# Patient Record
Sex: Female | Born: 1950
Health system: Southern US, Community
[De-identification: ages and names within clinical notes are randomized; demographics above are authoritative.]

## PROBLEM LIST (undated history)

## (undated) DIAGNOSIS — M25473 Effusion, unspecified ankle: Secondary | ICD-10-CM

## (undated) DIAGNOSIS — Z87442 Personal history of urinary calculi: Secondary | ICD-10-CM

## (undated) DIAGNOSIS — M1990 Unspecified osteoarthritis, unspecified site: Secondary | ICD-10-CM

## (undated) DIAGNOSIS — R0600 Dyspnea, unspecified: Secondary | ICD-10-CM

## (undated) DIAGNOSIS — Z9889 Other specified postprocedural states: Secondary | ICD-10-CM

## (undated) DIAGNOSIS — J45909 Unspecified asthma, uncomplicated: Secondary | ICD-10-CM

## (undated) DIAGNOSIS — I10 Essential (primary) hypertension: Secondary | ICD-10-CM

## (undated) DIAGNOSIS — R112 Nausea with vomiting, unspecified: Secondary | ICD-10-CM

## (undated) DIAGNOSIS — E039 Hypothyroidism, unspecified: Secondary | ICD-10-CM

## (undated) HISTORY — PX: CARPAL TUNNEL RELEASE: SHX101

## (undated) HISTORY — PX: EYE SURGERY: SHX253

## (undated) HISTORY — PX: FOOT SURGERY: SHX648

## (undated) HISTORY — PX: JOINT REPLACEMENT: SHX530

## (undated) HISTORY — PX: HERNIA REPAIR: SHX51

## (undated) HISTORY — PX: CHOLECYSTECTOMY: SHX55

## (undated) HISTORY — PX: TOTAL HIP ARTHROPLASTY: SHX124

## (undated) HISTORY — PX: KIDNEY STONE SURGERY: SHX686

---

## 2007-02-28 ENCOUNTER — Ambulatory Visit: Payer: Self-pay | Admitting: Dermatology

## 2008-12-09 ENCOUNTER — Ambulatory Visit: Payer: Self-pay | Admitting: Family Medicine

## 2008-12-09 DIAGNOSIS — L03119 Cellulitis of unspecified part of limb: Secondary | ICD-10-CM

## 2008-12-09 DIAGNOSIS — L02619 Cutaneous abscess of unspecified foot: Secondary | ICD-10-CM

## 2008-12-14 ENCOUNTER — Ambulatory Visit: Payer: Self-pay | Admitting: Family Medicine

## 2009-02-07 IMAGING — CR DG CHEST 2V
1 series · 2 of 2 positions shown · non-contrast
Comparison: none

REASON FOR EXAM: SARCOID - SEND REPORT
COMMENTS:

[Series 1: view not recorded · 0.17mm/px · 2 of 2 slices shown]
[im 1/2]
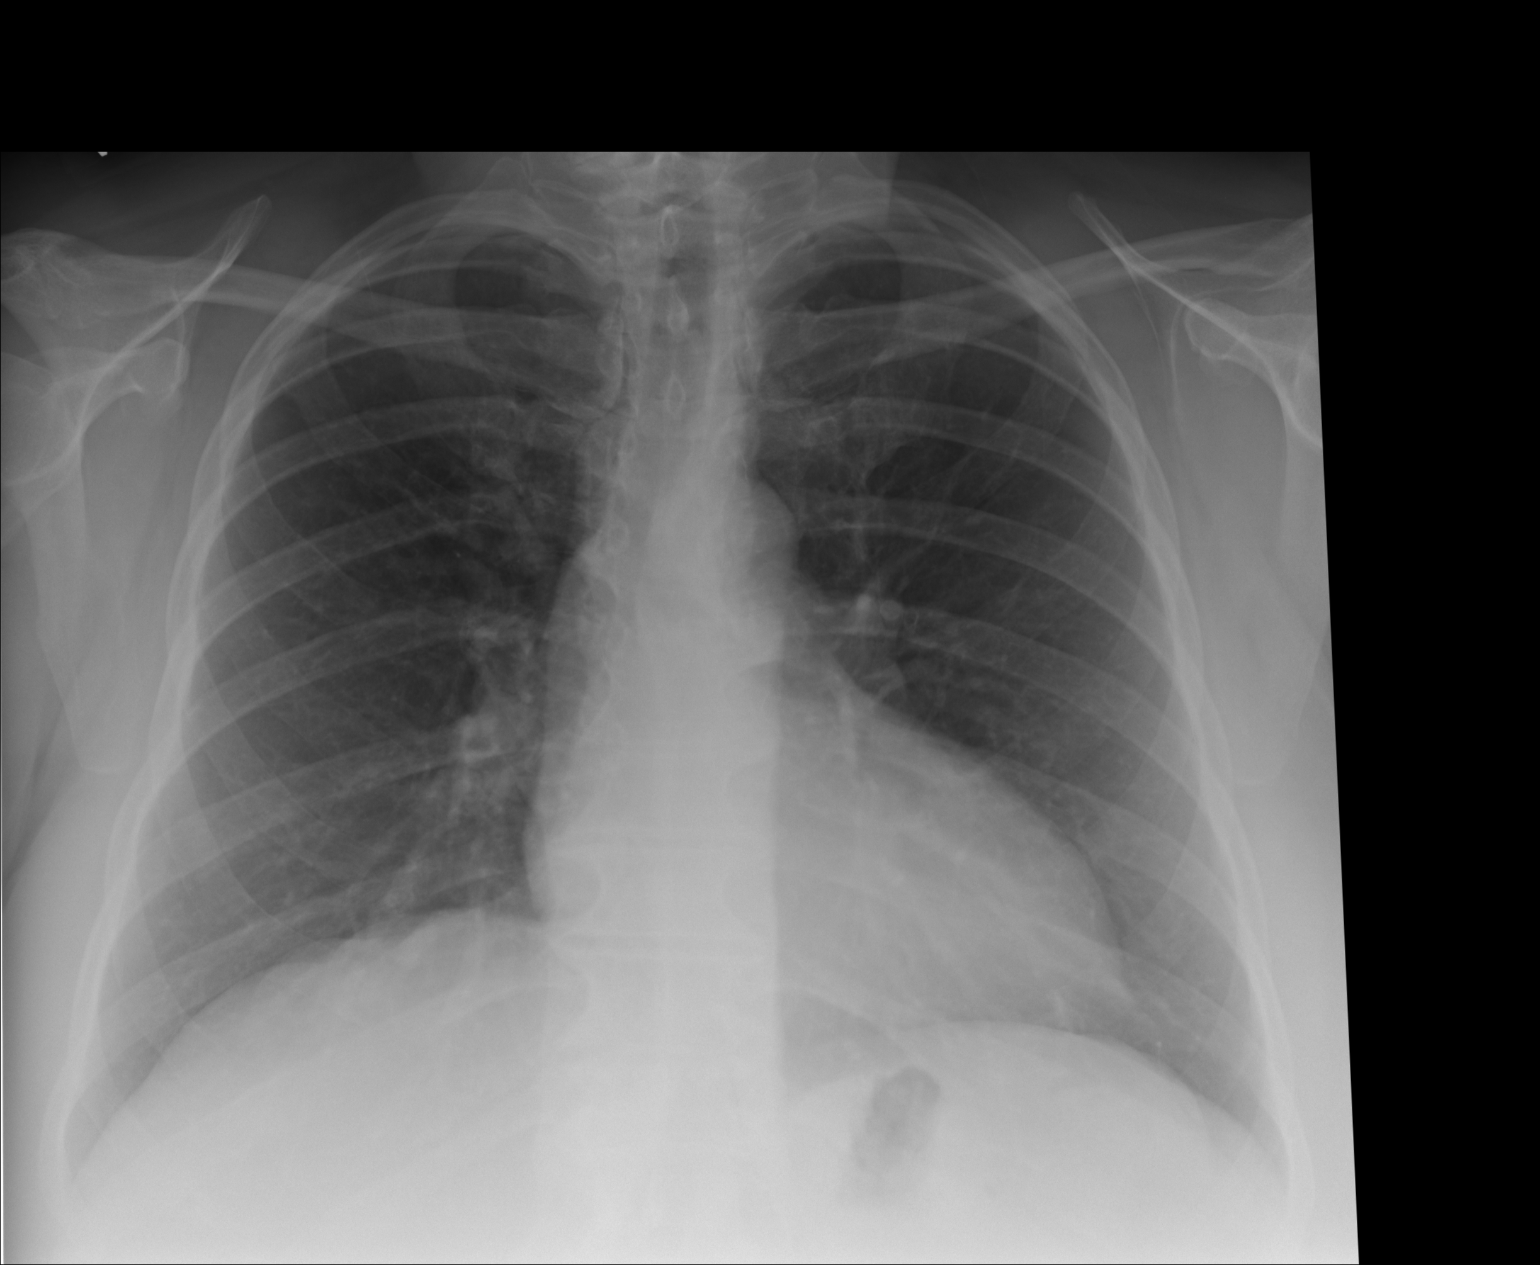
[im 2/2]
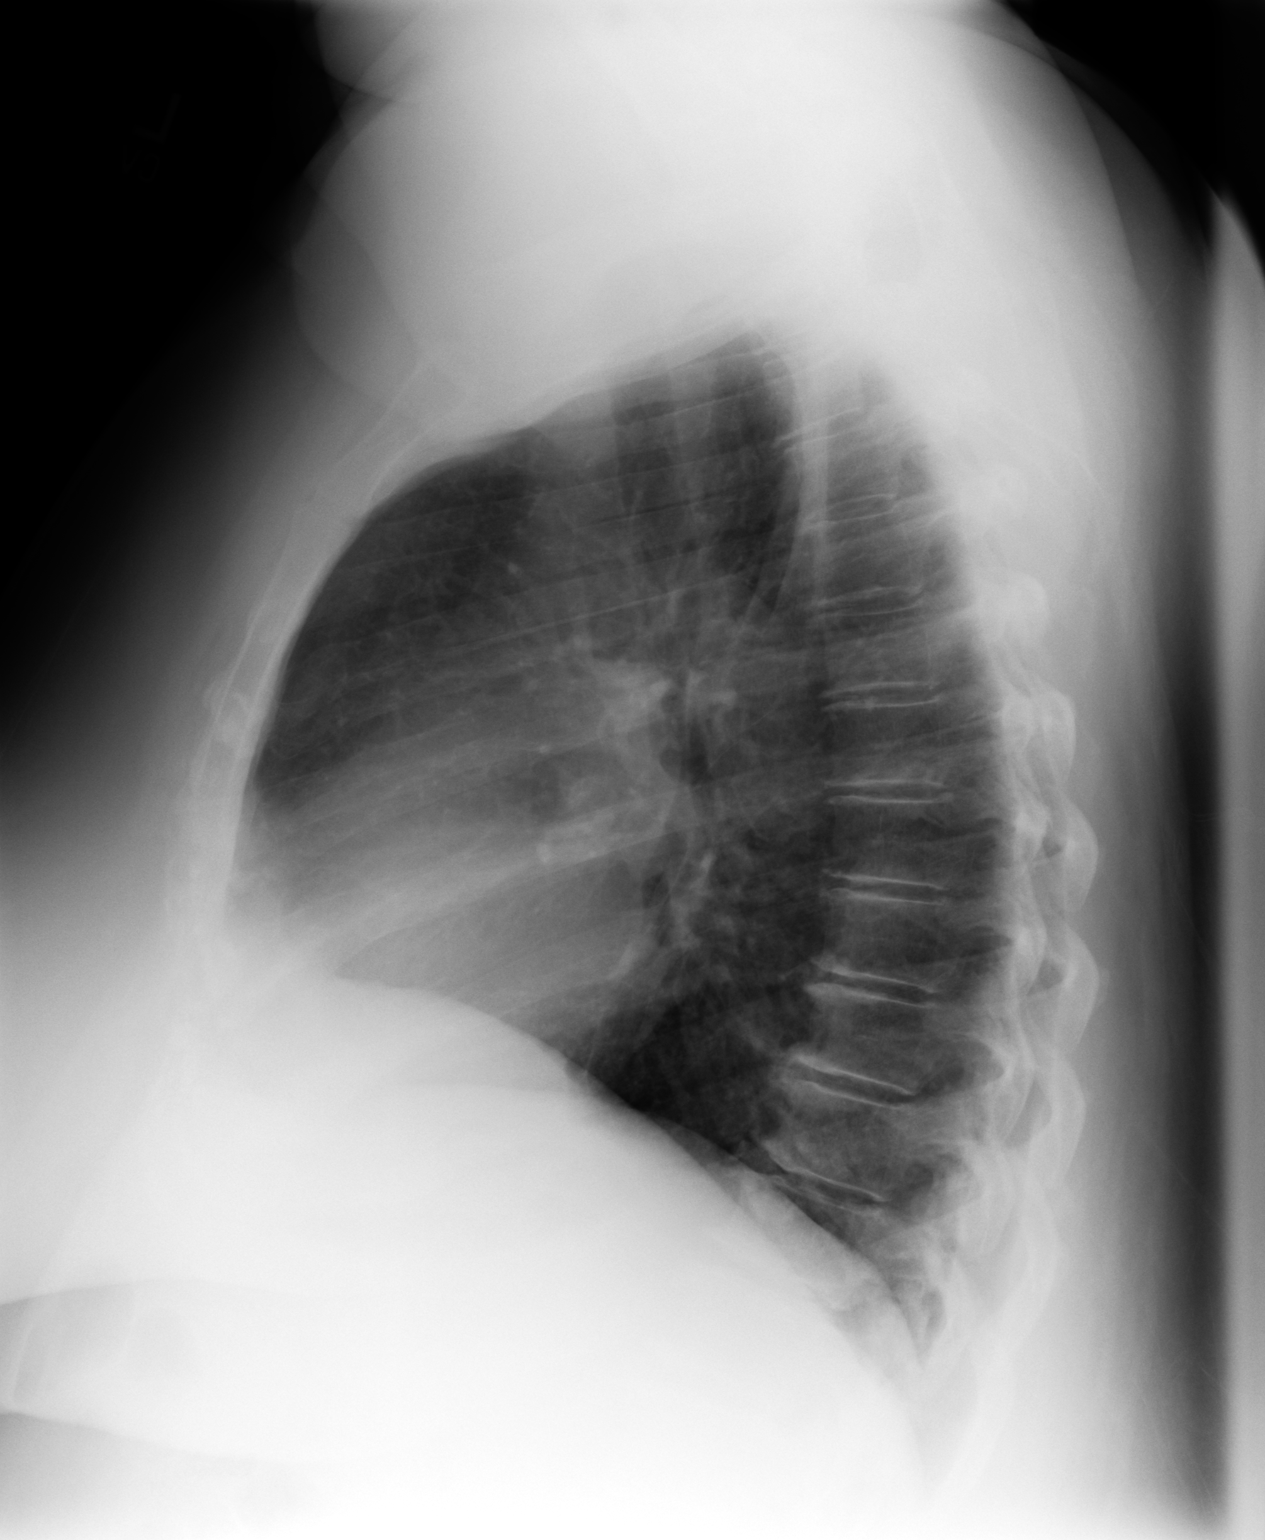

[2 of 2 positions shown; findings below may reference images not displayed]

PROCEDURE:     DXR - DXR CHEST PA (OR AP) AND LATERAL  - February 28, 2007 [DATE]

RESULT:     There no prior studies for comparison. The lungs are clear. The
heart and pulmonary vessels are normal. The bony and mediastinal structures
are unremarkable. There is no effusion. There is no pneumothorax or evidence
of congestive failure.
IMPRESSION: No acute cardiopulmonary disease.

## 2009-10-26 ENCOUNTER — Emergency Department: Payer: Self-pay | Admitting: Emergency Medicine

## 2019-05-03 ENCOUNTER — Other Ambulatory Visit: Payer: Self-pay

## 2019-05-03 ENCOUNTER — Encounter: Payer: Self-pay | Admitting: *Deleted

## 2019-05-05 ENCOUNTER — Other Ambulatory Visit
Admission: RE | Admit: 2019-05-05 | Discharge: 2019-05-05 | Disposition: A | Discharge status: Home / Self Care | Payer: BLUE CROSS/BLUE SHIELD | Source: Ambulatory Visit | Attending: Ophthalmology | Admitting: Ophthalmology

## 2019-05-05 ENCOUNTER — Other Ambulatory Visit: Payer: Self-pay

## 2019-05-05 DIAGNOSIS — Z01812 Encounter for preprocedural laboratory examination: Secondary | ICD-10-CM | POA: Insufficient documentation

## 2019-05-05 DIAGNOSIS — Z20828 Contact with and (suspected) exposure to other viral communicable diseases: Secondary | ICD-10-CM | POA: Diagnosis not present

## 2019-05-06 LAB — SARS CORONAVIRUS 2 (TAT 6-24 HRS): SARS Coronavirus 2: NEGATIVE

## 2019-05-09 NOTE — Discharge Instructions (Signed)

## 2019-05-10 ENCOUNTER — Ambulatory Visit
Admission: RE | Admit: 2019-05-10 | Discharge: 2019-05-10 | Disposition: A | Discharge status: Home / Self Care | Payer: Medicare HMO | Attending: Ophthalmology | Admitting: Ophthalmology

## 2019-05-10 ENCOUNTER — Ambulatory Visit: Payer: Medicare HMO | Admitting: Anesthesiology

## 2019-05-10 ENCOUNTER — Encounter
Admission: RE | Disposition: A | Discharge status: Home / Self Care | Payer: Self-pay | Source: Home / Self Care | Attending: Ophthalmology

## 2019-05-10 ENCOUNTER — Other Ambulatory Visit: Payer: Self-pay

## 2019-05-10 DIAGNOSIS — Z87891 Personal history of nicotine dependence: Secondary | ICD-10-CM | POA: Insufficient documentation

## 2019-05-10 DIAGNOSIS — H2511 Age-related nuclear cataract, right eye: Secondary | ICD-10-CM | POA: Insufficient documentation

## 2019-05-10 DIAGNOSIS — Z79899 Other long term (current) drug therapy: Secondary | ICD-10-CM | POA: Diagnosis not present

## 2019-05-10 DIAGNOSIS — E039 Hypothyroidism, unspecified: Secondary | ICD-10-CM | POA: Insufficient documentation

## 2019-05-10 DIAGNOSIS — Z79890 Hormone replacement therapy (postmenopausal): Secondary | ICD-10-CM | POA: Diagnosis not present

## 2019-05-10 DIAGNOSIS — Z6841 Body Mass Index (BMI) 40.0 and over, adult: Secondary | ICD-10-CM | POA: Diagnosis not present

## 2019-05-10 DIAGNOSIS — I10 Essential (primary) hypertension: Secondary | ICD-10-CM | POA: Diagnosis not present

## 2019-05-10 DIAGNOSIS — M1990 Unspecified osteoarthritis, unspecified site: Secondary | ICD-10-CM | POA: Insufficient documentation

## 2019-05-10 DIAGNOSIS — Z96643 Presence of artificial hip joint, bilateral: Secondary | ICD-10-CM | POA: Diagnosis not present

## 2019-05-10 HISTORY — PX: CATARACT EXTRACTION W/PHACO: SHX586

## 2019-05-10 HISTORY — DX: Personal history of urinary calculi: Z87.442

## 2019-05-10 HISTORY — DX: Hypothyroidism, unspecified: E03.9

## 2019-05-10 HISTORY — DX: Effusion, unspecified ankle: M25.473

## 2019-05-10 HISTORY — DX: Other specified postprocedural states: R11.2

## 2019-05-10 HISTORY — DX: Other specified postprocedural states: Z98.890

## 2019-05-10 HISTORY — DX: Unspecified osteoarthritis, unspecified site: M19.90

## 2019-05-10 HISTORY — DX: Essential (primary) hypertension: I10

## 2019-05-10 SURGERY — PHACOEMULSIFICATION, CATARACT, WITH IOL INSERTION
Anesthesia: Monitor Anesthesia Care | Site: Eye | Laterality: Right

## 2019-05-10 MED ORDER — EPINEPHRINE PF 1 MG/ML IJ SOLN
INTRAOCULAR | Status: DC | PRN
  Administered 2019-05-10: 48 mL via OPHTHALMIC

## 2019-05-10 MED ORDER — LACTATED RINGERS IV SOLN: INTRAVENOUS | Status: DC

## 2019-05-10 MED ORDER — MIDAZOLAM HCL 2 MG/2ML IJ SOLN
INTRAMUSCULAR | Status: DC | PRN
  Administered 2019-05-10: 2 mg via INTRAVENOUS

## 2019-05-10 MED ORDER — MOXIFLOXACIN HCL 0.5 % OP SOLN
1.0000 [drp] | OPHTHALMIC | Status: DC | PRN
  Administered 2019-05-10 (×3): 1 [drp] via OPHTHALMIC

## 2019-05-10 MED ORDER — FENTANYL CITRATE (PF) 100 MCG/2ML IJ SOLN
INTRAMUSCULAR | Status: DC | PRN
  Administered 2019-05-10 (×2): 50 ug via INTRAVENOUS

## 2019-05-10 MED ORDER — LIDOCAINE HCL (PF) 2 % IJ SOLN
INTRAOCULAR | Status: DC | PRN
  Administered 2019-05-10: 1 mL

## 2019-05-10 MED ORDER — ONDANSETRON HCL 4 MG/2ML IJ SOLN: 4.0000 mg | Freq: Once | INTRAMUSCULAR | Status: DC | PRN

## 2019-05-10 MED ORDER — ARMC OPHTHALMIC DILATING DROPS
1.0000 "application " | OPHTHALMIC | Status: DC | PRN
  Administered 2019-05-10 (×3): 1 via OPHTHALMIC

## 2019-05-10 MED ORDER — BRIMONIDINE TARTRATE-TIMOLOL 0.2-0.5 % OP SOLN
OPHTHALMIC | Status: DC | PRN
  Administered 2019-05-10: 1 [drp] via OPHTHALMIC

## 2019-05-10 MED ORDER — NA HYALUR & NA CHOND-NA HYALUR 0.4-0.35 ML IO KIT
PACK | INTRAOCULAR | Status: DC | PRN
  Administered 2019-05-10: 1 mL via INTRAOCULAR

## 2019-05-10 MED ORDER — CEFUROXIME OPHTHALMIC INJECTION 1 MG/0.1 ML
INJECTION | OPHTHALMIC | Status: DC | PRN
  Administered 2019-05-10: 0.1 mL via INTRACAMERAL

## 2019-05-10 MED ORDER — TETRACAINE HCL 0.5 % OP SOLN
1.0000 [drp] | OPHTHALMIC | Status: DC | PRN
  Administered 2019-05-10 (×3): 1 [drp] via OPHTHALMIC

## 2019-05-10 SURGICAL SUPPLY — 16 items
CANNULA ANT/CHMB 27G (MISCELLANEOUS) ×1 IMPLANT
CANNULA ANT/CHMB 27GA (MISCELLANEOUS) ×3 IMPLANT
GLOVE SURG LX 7.5 STRW (GLOVE) ×2
GLOVE SURG LX STRL 7.5 STRW (GLOVE) ×1 IMPLANT
GLOVE SURG TRIUMPH 8.0 PF LTX (GLOVE) ×3 IMPLANT
GOWN STRL REUS W/ TWL LRG LVL3 (GOWN DISPOSABLE) ×2 IMPLANT
GOWN STRL REUS W/TWL LRG LVL3 (GOWN DISPOSABLE) ×4
LENS IOL TECNIS ITEC 23.5 (Intraocular Lens) ×2 IMPLANT
MARKER SKIN DUAL TIP RULER LAB (MISCELLANEOUS) ×3 IMPLANT
PACK CATARACT BRASINGTON (MISCELLANEOUS) ×3 IMPLANT
PACK EYE AFTER SURG (MISCELLANEOUS) ×3 IMPLANT
PACK OPTHALMIC (MISCELLANEOUS) ×3 IMPLANT
SYR 3ML LL SCALE MARK (SYRINGE) ×3 IMPLANT
SYR TB 1ML LUER SLIP (SYRINGE) ×3 IMPLANT
WATER STERILE IRR 500ML POUR (IV SOLUTION) ×3 IMPLANT
WIPE NON LINTING 3.25X3.25 (MISCELLANEOUS) ×3 IMPLANT

## 2019-05-10 NOTE — H&P (Signed)

## 2019-05-10 NOTE — Anesthesia Procedure Notes (Signed)
Procedure Name: MAC Performed by: Briell Paulette, CRNA Pre-anesthesia Checklist: Patient identified, Emergency Drugs available, Suction available, Timeout performed and Patient being monitored Patient Re-evaluated:Patient Re-evaluated prior to induction Oxygen Delivery Method: Nasal cannula Placement Confirmation: positive ETCO2       

## 2019-05-10 NOTE — Anesthesia Preprocedure Evaluation (Signed)
Anesthesia Evaluation  Patient identified by MRN, date of birth, ID band Patient awake    History of Anesthesia Complications (+) PONV and history of anesthetic complications  Airway Mallampati: II  TM Distance: >3 FB Neck ROM: Full    Dental no notable dental hx.    Pulmonary former smoker,    Pulmonary exam normal breath sounds clear to auscultation       Cardiovascular Exercise Tolerance: Good hypertension, Normal cardiovascular exam Rhythm:Regular Rate:Normal     Neuro/Psych negative psych ROS   GI/Hepatic   Endo/Other  Hypothyroidism Morbid obesity  Renal/GU      Musculoskeletal  (+) Arthritis ,   Abdominal (+) + obese,   Peds  Hematology   Anesthesia Other Findings   Reproductive/Obstetrics                             Anesthesia Physical Anesthesia Plan  ASA: III  Anesthesia Plan: MAC   Post-op Pain Management:    Induction: Intravenous  PONV Risk Score and Plan: 3 and Treatment may vary due to age or medical condition  Airway Management Planned: Natural Airway  Additional Equipment:   Intra-op Plan:   Post-operative Plan:   Informed Consent: I have reviewed the patients History and Physical, chart, labs and discussed the procedure including the risks, benefits and alternatives for the proposed anesthesia with the patient or authorized representative who has indicated his/her understanding and acceptance.     Dental advisory given  Plan Discussed with: CRNA and Anesthesiologist  Anesthesia Plan Comments:         Anesthesia Quick Evaluation  Patient Active Problem List   Diagnosis Date Noted  . CELLULITIS, FOOT, RIGHT 12/09/2008    No flowsheet data found. No flowsheet data found.  Risks and benefits of anesthesia discussed at length, patient or surrogate demonstrates understanding. Appropriately NPO. Plan to proceed with anesthesia.  Champ Mungo,  MD 05/10/19

## 2019-05-10 NOTE — Op Note (Signed)
LOCATION:  [facility]   PREOPERATIVE DIAGNOSIS:    Nuclear sclerotic cataract right eye. H25.11   POSTOPERATIVE DIAGNOSIS:  Nuclear sclerotic cataract right eye.     PROCEDURE:  Phacoemusification with posterior chamber intraocular lens placement of the right eye   ULTRASOUND TIME: Procedure(s): CATARACT EXTRACTION PHACO AND INTRAOCULAR LENS PLACEMENT (IOC) RIGHT  00:42.7  18.4%  7.89 (Right)  LENS:   Implant Name Type Inv. Item Serial No. Manufacturer Lot No. LRB No. Used Action  LENS IOL DIOP 23.5 - [id number] Intraocular Lens LENS IOL DIOP 23.5 [id number]  Right 1 Implanted         SURGEON:  Wyonia Hough, MD   ANESTHESIA:  Topical with tetracaine drops and 2% Xylocaine jelly, augmented with 1% preservative-free intracameral lidocaine.    COMPLICATIONS:  None.   DESCRIPTION OF PROCEDURE:  The patient was identified in the holding room and transported to the operating room and placed in the supine position under the operating microscope.  The right eye was identified as the operative eye and it was prepped and draped in the usual sterile ophthalmic fashion.   A 1 millimeter clear-corneal paracentesis was made at the 12:00 position.  0.5 ml of preservative-free 1% lidocaine was injected into the anterior chamber. The anterior chamber was filled with Viscoat viscoelastic.  A 2.4 millimeter keratome was used to make a near-clear corneal incision at the 9:00 position.  A curvilinear capsulorrhexis was made with a cystotome and capsulorrhexis forceps.  Balanced salt solution was used to hydrodissect and hydrodelineate the nucleus.   Phacoemulsification was then used in stop and chop fashion to remove the lens nucleus and epinucleus.  The remaining cortex was then removed using the irrigation and aspiration handpiece. Provisc was then placed into the capsular bag to distend it for lens placement.  A lens was then injected into the capsular bag.  The remaining  viscoelastic was aspirated.   Wounds were hydrated with balanced salt solution.  The anterior chamber was inflated to a physiologic pressure with balanced salt solution.  No wound leaks were noted. Cefuroxime 0.1 ml of a 10mg /ml solution was injected into the anterior chamber for a dose of 1 mg of intracameral antibiotic at the completion of the case.   Timolol and Brimonidine drops were applied to the eye.  The patient was taken to the recovery room in stable condition without complications of anesthesia or surgery.   Laquitta Dominski 05/10/2019, 8:55 AM

## 2019-05-10 NOTE — Anesthesia Postprocedure Evaluation (Signed)
Anesthesia Post Note  Patient: Dawn Lynch  Procedure(s) Performed: CATARACT EXTRACTION PHACO AND INTRAOCULAR LENS PLACEMENT (IOC) RIGHT  00:42.7  18.4%  7.89 (Right Eye)  Patient location during evaluation: PACU Anesthesia Type: MAC Level of consciousness: awake and alert Pain management: pain level controlled Vital Signs Assessment: post-procedure vital signs reviewed and stable Respiratory status: spontaneous breathing, nonlabored ventilation, respiratory function stable and patient connected to nasal cannula oxygen Cardiovascular status: stable and blood pressure returned to baseline Postop Assessment: no apparent nausea or vomiting Anesthetic complications: no    Sinda Du

## 2019-05-10 NOTE — Transfer of Care (Signed)
Immediate Anesthesia Transfer of Care Note  Patient: Dawn Lynch  Procedure(s) Performed: CATARACT EXTRACTION PHACO AND INTRAOCULAR LENS PLACEMENT (IOC) RIGHT  00:42.7  18.4%  7.89 (Right Eye)  Patient Location: PACU  Anesthesia Type: MAC  Level of Consciousness: awake, alert  and patient cooperative  Airway and Oxygen Therapy: Patient Spontanous Breathing and Patient connected to supplemental oxygen  Post-op Assessment: Post-op Vital signs reviewed, Patient's Cardiovascular Status Stable, Respiratory Function Stable, Patent Airway and No signs of Nausea or vomiting  Post-op Vital Signs: Reviewed and stable  Complications: No apparent anesthesia complications

## 2019-05-11 ENCOUNTER — Encounter: Payer: Self-pay | Admitting: Ophthalmology

## 2019-05-23 ENCOUNTER — Encounter: Payer: Self-pay | Admitting: *Deleted

## 2019-05-23 ENCOUNTER — Other Ambulatory Visit: Payer: Self-pay

## 2019-05-26 ENCOUNTER — Other Ambulatory Visit: Payer: Self-pay

## 2019-05-26 ENCOUNTER — Other Ambulatory Visit
Admission: RE | Admit: 2019-05-26 | Discharge: 2019-05-26 | Disposition: A | Discharge status: Home / Self Care | Payer: BLUE CROSS/BLUE SHIELD | Source: Ambulatory Visit | Attending: Ophthalmology | Admitting: Ophthalmology

## 2019-05-26 DIAGNOSIS — Z01812 Encounter for preprocedural laboratory examination: Secondary | ICD-10-CM | POA: Insufficient documentation

## 2019-05-26 DIAGNOSIS — Z20828 Contact with and (suspected) exposure to other viral communicable diseases: Secondary | ICD-10-CM | POA: Insufficient documentation

## 2019-05-27 LAB — SARS CORONAVIRUS 2 (TAT 6-24 HRS): SARS Coronavirus 2: NEGATIVE

## 2019-05-29 NOTE — Discharge Instructions (Signed)

## 2019-05-31 ENCOUNTER — Ambulatory Visit
Admission: RE | Admit: 2019-05-31 | Discharge: 2019-05-31 | Disposition: A | Discharge status: Home / Self Care | Payer: Medicare HMO | Attending: Ophthalmology | Admitting: Ophthalmology

## 2019-05-31 ENCOUNTER — Ambulatory Visit: Payer: Medicare HMO | Admitting: Anesthesiology

## 2019-05-31 ENCOUNTER — Encounter
Admission: RE | Disposition: A | Discharge status: Home / Self Care | Payer: Self-pay | Source: Home / Self Care | Attending: Ophthalmology

## 2019-05-31 ENCOUNTER — Other Ambulatory Visit: Payer: Self-pay

## 2019-05-31 DIAGNOSIS — Z87891 Personal history of nicotine dependence: Secondary | ICD-10-CM | POA: Insufficient documentation

## 2019-05-31 DIAGNOSIS — Z96643 Presence of artificial hip joint, bilateral: Secondary | ICD-10-CM | POA: Insufficient documentation

## 2019-05-31 DIAGNOSIS — H2512 Age-related nuclear cataract, left eye: Secondary | ICD-10-CM | POA: Diagnosis present

## 2019-05-31 DIAGNOSIS — Z79890 Hormone replacement therapy (postmenopausal): Secondary | ICD-10-CM | POA: Diagnosis not present

## 2019-05-31 DIAGNOSIS — M1990 Unspecified osteoarthritis, unspecified site: Secondary | ICD-10-CM | POA: Diagnosis not present

## 2019-05-31 DIAGNOSIS — I10 Essential (primary) hypertension: Secondary | ICD-10-CM | POA: Diagnosis not present

## 2019-05-31 DIAGNOSIS — Z79899 Other long term (current) drug therapy: Secondary | ICD-10-CM | POA: Insufficient documentation

## 2019-05-31 DIAGNOSIS — E039 Hypothyroidism, unspecified: Secondary | ICD-10-CM | POA: Diagnosis not present

## 2019-05-31 HISTORY — PX: CATARACT EXTRACTION W/PHACO: SHX586

## 2019-05-31 SURGERY — PHACOEMULSIFICATION, CATARACT, WITH IOL INSERTION
Anesthesia: Monitor Anesthesia Care | Site: Eye | Laterality: Left

## 2019-05-31 MED ORDER — LIDOCAINE HCL (PF) 2 % IJ SOLN
INTRAOCULAR | Status: DC | PRN
  Administered 2019-05-31: 1 mL

## 2019-05-31 MED ORDER — EPINEPHRINE PF 1 MG/ML IJ SOLN
INTRAOCULAR | Status: DC | PRN
  Administered 2019-05-31: 59 mL via OPHTHALMIC

## 2019-05-31 MED ORDER — FENTANYL CITRATE (PF) 100 MCG/2ML IJ SOLN
INTRAMUSCULAR | Status: DC | PRN
  Administered 2019-05-31 (×2): 50 ug via INTRAVENOUS

## 2019-05-31 MED ORDER — TETRACAINE HCL 0.5 % OP SOLN
1.0000 [drp] | OPHTHALMIC | Status: DC | PRN
  Administered 2019-05-31 (×3): 1 [drp] via OPHTHALMIC

## 2019-05-31 MED ORDER — ACETAMINOPHEN 325 MG PO TABS: 325.0000 mg | ORAL_TABLET | ORAL | Status: DC | PRN

## 2019-05-31 MED ORDER — MOXIFLOXACIN HCL 0.5 % OP SOLN
1.0000 [drp] | OPHTHALMIC | Status: DC | PRN
  Administered 2019-05-31 (×3): 1 [drp] via OPHTHALMIC

## 2019-05-31 MED ORDER — CEFUROXIME OPHTHALMIC INJECTION 1 MG/0.1 ML
INJECTION | OPHTHALMIC | Status: DC | PRN
  Administered 2019-05-31: 0.1 mL via INTRACAMERAL

## 2019-05-31 MED ORDER — MIDAZOLAM HCL 2 MG/2ML IJ SOLN
INTRAMUSCULAR | Status: DC | PRN
  Administered 2019-05-31: 2 mg via INTRAVENOUS

## 2019-05-31 MED ORDER — ARMC OPHTHALMIC DILATING DROPS
1.0000 "application " | OPHTHALMIC | Status: DC | PRN
  Administered 2019-05-31 (×3): 1 via OPHTHALMIC

## 2019-05-31 MED ORDER — ACETAMINOPHEN 160 MG/5ML PO SOLN: 325.0000 mg | ORAL | Status: DC | PRN

## 2019-05-31 MED ORDER — NA HYALUR & NA CHOND-NA HYALUR 0.4-0.35 ML IO KIT
PACK | INTRAOCULAR | Status: DC | PRN
  Administered 2019-05-31: 1 mL via INTRAOCULAR

## 2019-05-31 MED ORDER — BRIMONIDINE TARTRATE-TIMOLOL 0.2-0.5 % OP SOLN
OPHTHALMIC | Status: DC | PRN
  Administered 2019-05-31: 1 [drp] via OPHTHALMIC

## 2019-05-31 SURGICAL SUPPLY — 16 items
CANNULA ANT/CHMB 27G (MISCELLANEOUS) ×1 IMPLANT
CANNULA ANT/CHMB 27GA (MISCELLANEOUS) ×3 IMPLANT
GLOVE SURG LX 7.5 STRW (GLOVE) ×2
GLOVE SURG LX STRL 7.5 STRW (GLOVE) ×1 IMPLANT
GLOVE SURG TRIUMPH 8.0 PF LTX (GLOVE) ×3 IMPLANT
GOWN STRL REUS W/ TWL LRG LVL3 (GOWN DISPOSABLE) ×2 IMPLANT
GOWN STRL REUS W/TWL LRG LVL3 (GOWN DISPOSABLE) ×4
LENS IOL TECNIS ITEC 22.5 (Intraocular Lens) ×2 IMPLANT
MARKER SKIN DUAL TIP RULER LAB (MISCELLANEOUS) ×3 IMPLANT
PACK CATARACT BRASINGTON (MISCELLANEOUS) ×3 IMPLANT
PACK EYE AFTER SURG (MISCELLANEOUS) ×3 IMPLANT
PACK OPTHALMIC (MISCELLANEOUS) ×3 IMPLANT
SYR 3ML LL SCALE MARK (SYRINGE) ×3 IMPLANT
SYR TB 1ML LUER SLIP (SYRINGE) ×3 IMPLANT
WATER STERILE IRR 500ML POUR (IV SOLUTION) ×3 IMPLANT
WIPE NON LINTING 3.25X3.25 (MISCELLANEOUS) ×3 IMPLANT

## 2019-05-31 NOTE — H&P (Signed)

## 2019-05-31 NOTE — Anesthesia Postprocedure Evaluation (Signed)
Anesthesia Post Note  Patient: Dawn Lynch  Procedure(s) Performed: CATARACT EXTRACTION PHACO AND INTRAOCULAR LENS PLACEMENT (IOC) LEFT 3.65  00:36.9  9.9% (Left Eye)     Patient location during evaluation: PACU Anesthesia Type: MAC Level of consciousness: awake and alert Pain management: pain level controlled Vital Signs Assessment: post-procedure vital signs reviewed and stable Respiratory status: spontaneous breathing, nonlabored ventilation, respiratory function stable and patient connected to nasal cannula oxygen Cardiovascular status: stable and blood pressure returned to baseline Postop Assessment: no apparent nausea or vomiting Anesthetic complications: no    Trecia Rogers

## 2019-05-31 NOTE — Op Note (Signed)
OPERATIVE NOTE  Dawn Lynch [id number] 05/31/2019   PREOPERATIVE DIAGNOSIS:  Nuclear sclerotic cataract left eye. H25.12   POSTOPERATIVE DIAGNOSIS:    Nuclear sclerotic cataract left eye.     PROCEDURE:  Phacoemusification with posterior chamber intraocular lens placement of the left eye  Ultrasound time: Procedure(s): CATARACT EXTRACTION PHACO AND INTRAOCULAR LENS PLACEMENT (IOC) LEFT 3.65  00:36.9  9.9% (Left)  LENS:   Implant Name Type Inv. Item Serial No. Manufacturer Lot No. LRB No. Used Action  LENS IOL DIOP 22.5 - [id number] Intraocular Lens LENS IOL DIOP 22.5 [id number]  Left 1 Implanted      SURGEON:  Wyonia Hough, MD   ANESTHESIA:  Topical with tetracaine drops and 2% Xylocaine jelly, augmented with 1% preservative-free intracameral lidocaine.    COMPLICATIONS:  None.   DESCRIPTION OF PROCEDURE:  The patient was identified in the holding room and transported to the operating room and placed in the supine position under the operating microscope.  The left eye was identified as the operative eye and it was prepped and draped in the usual sterile ophthalmic fashion.   A 1 millimeter clear-corneal paracentesis was made at the 1:30 position.  0.5 ml of preservative-free 1% lidocaine was injected into the anterior chamber.  The anterior chamber was filled with Viscoat viscoelastic.  A 2.4 millimeter keratome was used to make a near-clear corneal incision at the 10:30 position.  .  A curvilinear capsulorrhexis was made with a cystotome and capsulorrhexis forceps.  Balanced salt solution was used to hydrodissect and hydrodelineate the nucleus.   Phacoemulsification was then used in stop and chop fashion to remove the lens nucleus and epinucleus.  The remaining cortex was then removed using the irrigation and aspiration handpiece. Provisc was then placed into the capsular bag to distend it for lens placement.  A lens was then injected into the capsular bag.  The  remaining viscoelastic was aspirated.   Wounds were hydrated with balanced salt solution.  The anterior chamber was inflated to a physiologic pressure with balanced salt solution.  No wound leaks were noted. Cefuroxime 0.1 ml of a 10mg /ml solution was injected into the anterior chamber for a dose of 1 mg of intracameral antibiotic at the completion of the case.   Timolol and Brimonidine drops were applied to the eye.  The patient was taken to the recovery room in stable condition without complications of anesthesia or surgery.  [name] 05/31/2019, 10:41 AM

## 2019-05-31 NOTE — Anesthesia Preprocedure Evaluation (Signed)
Anesthesia Evaluation  Patient identified by MRN, date of birth, ID band Patient awake    Reviewed: Allergy & Precautions, H&P , NPO status , Patient's Chart, lab work & pertinent test results, reviewed documented beta blocker date and time   History of Anesthesia Complications (+) PONV and history of anesthetic complications  Airway Mallampati: II  TM Distance: >3 FB Neck ROM: full    Dental no notable dental hx.    Pulmonary neg pulmonary ROS, former smoker,    Pulmonary exam normal breath sounds clear to auscultation       Cardiovascular Exercise Tolerance: Good hypertension, Normal cardiovascular exam Rhythm:regular Rate:Normal     Neuro/Psych negative neurological ROS  negative psych ROS   GI/Hepatic negative GI ROS, Neg liver ROS,   Endo/Other  Hypothyroidism   Renal/GU negative Renal ROS  negative genitourinary   Musculoskeletal   Abdominal   Peds  Hematology negative hematology ROS (+)   Anesthesia Other Findings   Reproductive/Obstetrics negative OB ROS                             Anesthesia Physical Anesthesia Plan  ASA: II  Anesthesia Plan: MAC   Post-op Pain Management:    Induction:   PONV Risk Score and Plan: Ondansetron  Airway Management Planned:   Additional Equipment:   Intra-op Plan:   Post-operative Plan:   Informed Consent: I have reviewed the patients History and Physical, chart, labs and discussed the procedure including the risks, benefits and alternatives for the proposed anesthesia with the patient or authorized representative who has indicated his/her understanding and acceptance.     Dental Advisory Given  Plan Discussed with: CRNA  Anesthesia Plan Comments:         Anesthesia Quick Evaluation

## 2019-05-31 NOTE — Transfer of Care (Signed)
Immediate Anesthesia Transfer of Care Note  Patient: Dawn Lynch  Procedure(s) Performed: CATARACT EXTRACTION PHACO AND INTRAOCULAR LENS PLACEMENT (IOC) LEFT (Left Eye)  Patient Location: PACU  Anesthesia Type: MAC  Level of Consciousness: awake, alert  and patient cooperative  Airway and Oxygen Therapy: Patient Spontanous Breathing and Patient connected to supplemental oxygen  Post-op Assessment: Post-op Vital signs reviewed, Patient's Cardiovascular Status Stable, Respiratory Function Stable, Patent Airway and No signs of Nausea or vomiting  Post-op Vital Signs: Reviewed and stable  Complications: No apparent anesthesia complications

## 2019-06-01 ENCOUNTER — Encounter: Payer: Self-pay | Admitting: Ophthalmology

## 2019-07-10 ENCOUNTER — Other Ambulatory Visit: Payer: Self-pay | Admitting: Nurse Practitioner

## 2019-07-10 DIAGNOSIS — U071 COVID-19: Secondary | ICD-10-CM

## 2019-07-10 DIAGNOSIS — Z6841 Body Mass Index (BMI) 40.0 and over, adult: Secondary | ICD-10-CM

## 2019-07-10 DIAGNOSIS — E66813 Obesity, class 3: Secondary | ICD-10-CM

## 2019-07-10 NOTE — Progress Notes (Signed)
  I connected by phone with Dawn Lynch on 07/10/2019 at 10:14 AM to discuss the potential use of an new treatment for mild to moderate COVID-19 viral infection in non-hospitalized patients.  This patient is a 68 y.o. female that meets the FDA criteria for Emergency Use Authorization of bamlanivimab or casirivimab\imdevimab.  Has a (+) direct SARS-CoV-2 viral test result  Has mild or moderate COVID-19   Is ? 66 years of age and weighs ? 40 kg  Is NOT hospitalized due to COVID-19  Is NOT requiring oxygen therapy or requiring an increase in baseline oxygen flow rate due to COVID-19  Is within 10 days of symptom onset  Has at least one of the high risk factor(s) for progression to severe COVID-19 and/or hospitalization as defined in EUA.  Specific high risk criteria : BMI >/= 35 Patient is over the age of 64 and BMI is currently 54.   I have spoken and communicated the following to the patient or parent/caregiver:  1. FDA has authorized the emergency use of bamlanivimab and casirivimab\imdevimab for the treatment of mild to moderate COVID-19 in adults and pediatric patients with positive results of direct SARS-CoV-2 viral testing who are 62 years of age and older weighing at least 40 kg, and who are at high risk for progressing to severe COVID-19 and/or hospitalization.  2. The significant known and potential risks and benefits of bamlanivimab and casirivimab\imdevimab, and the extent to which such potential risks and benefits are unknown.  3. Information on available alternative treatments and the risks and benefits of those alternatives, including clinical trials.  4. Patients treated with bamlanivimab and casirivimab\imdevimab should continue to self-isolate and use infection control measures (e.g., wear mask, isolate, social distance, avoid sharing personal items, clean and disinfect "high touch" surfaces, and frequent handwashing) according to CDC guidelines.   5. The patient  or parent/caregiver has the option to accept or refuse bamlanivimab or casirivimab\imdevimab .  After reviewing this information with the patient, The patient agreed to proceed with receiving the bamlanimivab infusion and will be provided a copy of the Fact sheet prior to receiving the infusion.Fenton Foy 07/10/2019 10:14 AM  Note: Patient was tested for COVID at [facility]. She will need a copy of her positive COVID test scanned into the chart before infusion.

## 2019-07-12 ENCOUNTER — Ambulatory Visit (HOSPITAL_COMMUNITY)
Admission: RE | Admit: 2019-07-12 | Discharge: 2019-07-12 | Disposition: A | Discharge status: Home / Self Care | Payer: BLUE CROSS/BLUE SHIELD | Source: Ambulatory Visit | Attending: Pulmonary Disease | Admitting: Pulmonary Disease

## 2019-07-12 DIAGNOSIS — Z6841 Body Mass Index (BMI) 40.0 and over, adult: Secondary | ICD-10-CM | POA: Insufficient documentation

## 2019-07-12 DIAGNOSIS — Z23 Encounter for immunization: Secondary | ICD-10-CM | POA: Diagnosis not present

## 2019-07-12 DIAGNOSIS — U071 COVID-19: Secondary | ICD-10-CM | POA: Insufficient documentation

## 2019-07-12 DIAGNOSIS — E66813 Obesity, class 3: Secondary | ICD-10-CM

## 2019-07-12 MED ORDER — ALBUTEROL SULFATE HFA 108 (90 BASE) MCG/ACT IN AERS: 2.0000 | INHALATION_SPRAY | Freq: Once | RESPIRATORY_TRACT | Status: DC | PRN

## 2019-07-12 MED ORDER — DIPHENHYDRAMINE HCL 50 MG/ML IJ SOLN: 50.0000 mg | Freq: Once | INTRAMUSCULAR | Status: DC | PRN

## 2019-07-12 MED ORDER — SODIUM CHLORIDE 0.9 % IV SOLN: INTRAVENOUS | Status: DC | PRN

## 2019-07-12 MED ORDER — METHYLPREDNISOLONE SODIUM SUCC 125 MG IJ SOLR: 125.0000 mg | Freq: Once | INTRAMUSCULAR | Status: DC | PRN

## 2019-07-12 MED ORDER — EPINEPHRINE 0.3 MG/0.3ML IJ SOAJ: 0.3000 mg | Freq: Once | INTRAMUSCULAR | Status: DC | PRN

## 2019-07-12 MED ORDER — SODIUM CHLORIDE 0.9 % IV SOLN
700.0000 mg | Freq: Once | INTRAVENOUS | Status: AC
  Administered 2019-07-12: 700 mg via INTRAVENOUS
  Filled 2019-07-12: qty 20

## 2019-07-12 MED ORDER — FAMOTIDINE IN NACL 20-0.9 MG/50ML-% IV SOLN: 20.0000 mg | Freq: Once | INTRAVENOUS | Status: DC | PRN

## 2019-07-12 NOTE — Progress Notes (Signed)
  Diagnosis: COVID-19  Physician: Dr. Joya Gaskins  Procedure: [facility] Med: bamlanivimab infusion - Provided patient with bamlanimivab fact sheet for patients, parents and caregivers prior to infusion.  Complications: No immediate complications noted.  Discharge: Discharged home   Dawn Lynch 07/12/2019

## 2019-07-12 NOTE — Discharge Instructions (Signed)
10 Things You Can Do to Manage Your COVID-19 Symptoms at Home If you have possible or confirmed COVID-19: 1. Stay home from work and school. And stay away from other public places. If you must go out, avoid using any kind of public transportation, ridesharing, or taxis. 2. Monitor your symptoms carefully. If your symptoms get worse, call your healthcare provider immediately. 3. Get rest and stay hydrated. 4. If you have a medical appointment, call the healthcare provider ahead of time and tell them that you have or may have COVID-19. 5. For medical emergencies, call 911 and notify the dispatch personnel that you have or may have COVID-19. 6. Cover your cough and sneezes with a tissue or use the inside of your elbow. 7. Wash your hands often with soap and water for at least 20 seconds or clean your hands with an alcohol-based hand sanitizer that contains at least 60% alcohol. 8. As much as possible, stay in a specific room and away from other people in your home. Also, you should use a separate bathroom, if available. If you need to be around other people in or outside of the home, wear a mask. 9. Avoid sharing personal items with other people in your household, like dishes, towels, and bedding. 10. Clean all surfaces that are touched often, like counters, tabletops, and doorknobs. Use household cleaning sprays or wipes according to the label instructions. cdc.gov/coronavirus 01/11/2019 This information is not intended to replace advice given to you by your health care provider. Make sure you discuss any questions you have with your health care provider. Document Revised: 06/15/2019 Document Reviewed: 06/15/2019 Elsevier Patient Education  2020 Elsevier Inc.  

## 2019-09-18 DIAGNOSIS — Z961 Presence of intraocular lens: Secondary | ICD-10-CM | POA: Diagnosis not present

## 2019-09-30 DIAGNOSIS — M62838 Other muscle spasm: Secondary | ICD-10-CM | POA: Diagnosis not present

## 2019-10-03 DIAGNOSIS — M47812 Spondylosis without myelopathy or radiculopathy, cervical region: Secondary | ICD-10-CM | POA: Diagnosis not present

## 2019-10-09 DIAGNOSIS — M5030 Other cervical disc degeneration, unspecified cervical region: Secondary | ICD-10-CM | POA: Diagnosis not present

## 2019-10-09 DIAGNOSIS — G4489 Other headache syndrome: Secondary | ICD-10-CM | POA: Diagnosis not present

## 2019-10-23 ENCOUNTER — Ambulatory Visit: Payer: PPO | Attending: Internal Medicine

## 2019-10-23 DIAGNOSIS — Z23 Encounter for immunization: Secondary | ICD-10-CM

## 2019-10-23 NOTE — Progress Notes (Signed)
   Covid-19 Vaccination Clinic  Name:  Dawn Lynch    MRN: [id number] DOB: [birth year]  10/23/2019  Dawn Lynch was observed post Covid-19 immunization for 15 minutes without incident. She was provided with Vaccine Information Sheet and instruction to access the V-Safe system.   Dawn Lynch was instructed to call 911 with any severe reactions post vaccine: Marland Kitchen Difficulty breathing  . Swelling of face and throat  . A fast heartbeat  . A bad rash all over body  . Dizziness and weakness   Immunizations Administered    Name Date Dose VIS Date Route   Pfizer COVID-19 Vaccine 10/23/2019 10:28 AM 0.3 mL 06/23/2019 Intramuscular   Manufacturer: [place]   Lot: [id number]   [place]: [id number]

## 2019-10-30 DIAGNOSIS — M5030 Other cervical disc degeneration, unspecified cervical region: Secondary | ICD-10-CM | POA: Diagnosis not present

## 2019-10-30 DIAGNOSIS — G4489 Other headache syndrome: Secondary | ICD-10-CM | POA: Diagnosis not present

## 2019-11-02 DIAGNOSIS — M5030 Other cervical disc degeneration, unspecified cervical region: Secondary | ICD-10-CM | POA: Diagnosis not present

## 2019-11-06 DIAGNOSIS — M5030 Other cervical disc degeneration, unspecified cervical region: Secondary | ICD-10-CM | POA: Diagnosis not present

## 2019-11-14 ENCOUNTER — Ambulatory Visit: Payer: PPO | Attending: Internal Medicine

## 2019-11-14 DIAGNOSIS — Z23 Encounter for immunization: Secondary | ICD-10-CM

## 2019-11-14 NOTE — Progress Notes (Signed)
   Covid-19 Vaccination Clinic  Name:  Dawn Lynch    MRN: [id number] DOB: 14-May-1951  11/14/2019  [name] was observed post Covid-19 immunization for 15 minutes without incident. She was provided with Vaccine Information Sheet and instruction to access the V-Safe system.   [name] was instructed to call 911 with any severe reactions post vaccine: Marland Kitchen Difficulty breathing  . Swelling of face and throat  . A fast heartbeat  . A bad rash all over body  . Dizziness and weakness   Immunizations Administered    Name Date Dose VIS Date Route   Pfizer COVID-19 Vaccine 11/14/2019  3:07 PM 0.3 mL 09/06/2018 Intramuscular   Manufacturer: [place]   Lot: [id number]   [place]: [id number]

## 2019-11-21 DIAGNOSIS — R519 Headache, unspecified: Secondary | ICD-10-CM | POA: Diagnosis not present

## 2019-11-21 DIAGNOSIS — E039 Hypothyroidism, unspecified: Secondary | ICD-10-CM | POA: Diagnosis not present

## 2019-11-21 DIAGNOSIS — R600 Localized edema: Secondary | ICD-10-CM | POA: Diagnosis not present

## 2019-11-21 DIAGNOSIS — I10 Essential (primary) hypertension: Secondary | ICD-10-CM | POA: Diagnosis not present

## 2019-12-05 DIAGNOSIS — R519 Headache, unspecified: Secondary | ICD-10-CM | POA: Diagnosis not present

## 2019-12-05 DIAGNOSIS — E039 Hypothyroidism, unspecified: Secondary | ICD-10-CM | POA: Diagnosis not present

## 2019-12-05 DIAGNOSIS — I10 Essential (primary) hypertension: Secondary | ICD-10-CM | POA: Diagnosis not present

## 2019-12-05 DIAGNOSIS — Z0000 Encounter for general adult medical examination without abnormal findings: Secondary | ICD-10-CM | POA: Diagnosis not present

## 2019-12-18 DIAGNOSIS — Z1231 Encounter for screening mammogram for malignant neoplasm of breast: Secondary | ICD-10-CM | POA: Diagnosis not present

## 2019-12-18 DIAGNOSIS — Z1151 Encounter for screening for human papillomavirus (HPV): Secondary | ICD-10-CM | POA: Diagnosis not present

## 2019-12-18 DIAGNOSIS — Z6841 Body Mass Index (BMI) 40.0 and over, adult: Secondary | ICD-10-CM | POA: Diagnosis not present

## 2019-12-18 DIAGNOSIS — Z01419 Encounter for gynecological examination (general) (routine) without abnormal findings: Secondary | ICD-10-CM | POA: Diagnosis not present

## 2019-12-25 DIAGNOSIS — I10 Essential (primary) hypertension: Secondary | ICD-10-CM | POA: Diagnosis not present

## 2020-01-23 DIAGNOSIS — L600 Ingrowing nail: Secondary | ICD-10-CM | POA: Diagnosis not present

## 2020-01-23 DIAGNOSIS — M79674 Pain in right toe(s): Secondary | ICD-10-CM | POA: Diagnosis not present

## 2020-02-27 DIAGNOSIS — H43812 Vitreous degeneration, left eye: Secondary | ICD-10-CM | POA: Diagnosis not present

## 2020-03-19 DIAGNOSIS — M216X1 Other acquired deformities of right foot: Secondary | ICD-10-CM | POA: Diagnosis not present

## 2020-03-19 DIAGNOSIS — S90121A Contusion of right lesser toe(s) without damage to nail, initial encounter: Secondary | ICD-10-CM | POA: Diagnosis not present

## 2020-03-19 DIAGNOSIS — M2141 Flat foot [pes planus] (acquired), right foot: Secondary | ICD-10-CM | POA: Diagnosis not present

## 2020-03-19 DIAGNOSIS — M79674 Pain in right toe(s): Secondary | ICD-10-CM | POA: Diagnosis not present

## 2020-03-19 DIAGNOSIS — M2041 Other hammer toe(s) (acquired), right foot: Secondary | ICD-10-CM | POA: Diagnosis not present

## 2020-03-19 DIAGNOSIS — M2042 Other hammer toe(s) (acquired), left foot: Secondary | ICD-10-CM | POA: Diagnosis not present

## 2020-03-19 DIAGNOSIS — M2011 Hallux valgus (acquired), right foot: Secondary | ICD-10-CM | POA: Diagnosis not present

## 2020-03-19 DIAGNOSIS — S9031XA Contusion of right foot, initial encounter: Secondary | ICD-10-CM | POA: Diagnosis not present

## 2020-03-19 DIAGNOSIS — M2012 Hallux valgus (acquired), left foot: Secondary | ICD-10-CM | POA: Diagnosis not present

## 2020-03-19 DIAGNOSIS — M216X2 Other acquired deformities of left foot: Secondary | ICD-10-CM | POA: Diagnosis not present

## 2020-03-19 DIAGNOSIS — M2142 Flat foot [pes planus] (acquired), left foot: Secondary | ICD-10-CM | POA: Diagnosis not present

## 2020-03-25 DIAGNOSIS — R519 Headache, unspecified: Secondary | ICD-10-CM | POA: Diagnosis not present

## 2020-04-01 DIAGNOSIS — R21 Rash and other nonspecific skin eruption: Secondary | ICD-10-CM | POA: Diagnosis not present

## 2020-04-01 DIAGNOSIS — L814 Other melanin hyperpigmentation: Secondary | ICD-10-CM | POA: Diagnosis not present

## 2020-04-01 DIAGNOSIS — D225 Melanocytic nevi of trunk: Secondary | ICD-10-CM | POA: Diagnosis not present

## 2020-06-03 DIAGNOSIS — M5030 Other cervical disc degeneration, unspecified cervical region: Secondary | ICD-10-CM | POA: Diagnosis not present

## 2020-06-10 DIAGNOSIS — I10 Essential (primary) hypertension: Secondary | ICD-10-CM | POA: Diagnosis not present

## 2020-06-10 DIAGNOSIS — Z6841 Body Mass Index (BMI) 40.0 and over, adult: Secondary | ICD-10-CM | POA: Diagnosis not present

## 2020-06-10 DIAGNOSIS — E039 Hypothyroidism, unspecified: Secondary | ICD-10-CM | POA: Diagnosis not present

## 2020-06-24 DIAGNOSIS — I10 Essential (primary) hypertension: Secondary | ICD-10-CM | POA: Diagnosis not present

## 2023-07-15 ENCOUNTER — Other Ambulatory Visit: Payer: Self-pay | Admitting: Orthopedic Surgery

## 2023-08-03 ENCOUNTER — Encounter
Admission: RE | Admit: 2023-08-03 | Discharge: 2023-08-03 | Disposition: A | Discharge status: Home / Self Care | Payer: Medicare HMO | Source: Ambulatory Visit | Attending: Orthopedic Surgery | Admitting: Orthopedic Surgery

## 2023-08-03 ENCOUNTER — Other Ambulatory Visit: Payer: Self-pay

## 2023-08-03 VITALS — BP 111/60 | HR 82 | Temp 97.6°F | Resp 18 | Ht 66.5 in | Wt 245.7 lb

## 2023-08-03 DIAGNOSIS — Z01818 Encounter for other preprocedural examination: Secondary | ICD-10-CM | POA: Diagnosis present

## 2023-08-03 DIAGNOSIS — Z01810 Encounter for preprocedural cardiovascular examination: Secondary | ICD-10-CM | POA: Diagnosis not present

## 2023-08-03 DIAGNOSIS — E669 Obesity, unspecified: Secondary | ICD-10-CM | POA: Insufficient documentation

## 2023-08-03 DIAGNOSIS — Z01812 Encounter for preprocedural laboratory examination: Secondary | ICD-10-CM

## 2023-08-03 HISTORY — DX: Unspecified asthma, uncomplicated: J45.909

## 2023-08-03 HISTORY — DX: Dyspnea, unspecified: R06.00

## 2023-08-03 LAB — COMPREHENSIVE METABOLIC PANEL
ALT: 16 U/L (ref 0–44)
AST: 19 U/L (ref 15–41)
Albumin: 4 g/dL (ref 3.5–5.0)
Alkaline Phosphatase: 56 U/L (ref 38–126)
Anion gap: 7 (ref 5–15)
BUN: 39 mg/dL — ABNORMAL HIGH (ref 8–23)
CO2: 27 mmol/L (ref 22–32)
Calcium: 9.5 mg/dL (ref 8.9–10.3)
Chloride: 105 mmol/L (ref 98–111)
Creatinine, Ser: 1.03 mg/dL — ABNORMAL HIGH (ref 0.44–1.00)
GFR, Estimated: 58 mL/min — ABNORMAL LOW (ref >60)
Glucose, Bld: 99 mg/dL (ref 70–99)
Potassium: 4.3 mmol/L (ref 3.5–5.1)
Sodium: 139 mmol/L (ref 135–145)
Total Bilirubin: 0.7 mg/dL (ref 0.0–1.2)
Total Protein: 6.7 g/dL (ref 6.5–8.1)

## 2023-08-03 LAB — CBC WITH DIFFERENTIAL/PLATELET
Abs Immature Granulocytes: 0.02 10*3/uL (ref 0.00–0.07)
Basophils Absolute: 0 10*3/uL (ref 0.0–0.1)
Basophils Relative: 1 %
Eosinophils Absolute: 0.4 10*3/uL (ref 0.0–0.5)
Eosinophils Relative: 6 %
HCT: 35.3 % — ABNORMAL LOW (ref 36.0–46.0)
Hemoglobin: 11.8 g/dL — ABNORMAL LOW (ref 12.0–15.0)
Immature Granulocytes: 0 %
Lymphocytes Relative: 28 %
Lymphs Abs: 1.9 10*3/uL (ref 0.7–4.0)
MCH: 31.6 pg (ref 26.0–34.0)
MCHC: 33.4 g/dL (ref 30.0–36.0)
MCV: 94.6 fL (ref 80.0–100.0)
Monocytes Absolute: 0.5 10*3/uL (ref 0.1–1.0)
Monocytes Relative: 7 %
Neutro Abs: 3.9 10*3/uL (ref 1.7–7.7)
Neutrophils Relative %: 58 %
Platelets: 366 10*3/uL (ref 150–400)
RBC: 3.73 MIL/uL — ABNORMAL LOW (ref 3.87–5.11)
RDW: 12.7 % (ref 11.5–15.5)
WBC: 6.7 10*3/uL (ref 4.0–10.5)
nRBC: 0 % (ref 0.0–0.2)

## 2023-08-03 LAB — URINALYSIS, ROUTINE W REFLEX MICROSCOPIC
Bilirubin Urine: NEGATIVE
Glucose, UA: NEGATIVE mg/dL
Hgb urine dipstick: NEGATIVE
Ketones, ur: NEGATIVE mg/dL
Leukocytes,Ua: NEGATIVE
Nitrite: NEGATIVE
Protein, ur: NEGATIVE mg/dL
Specific Gravity, Urine: 1.02 (ref 1.005–1.030)
pH: 5 (ref 5.0–8.0)

## 2023-08-03 LAB — SURGICAL PCR SCREEN
MRSA, PCR: NEGATIVE
Staphylococcus aureus: NEGATIVE

## 2023-08-03 LAB — HEMOGLOBIN A1C
Hgb A1c MFr Bld: 5.7 % — ABNORMAL HIGH (ref 4.8–5.6)
Mean Plasma Glucose: 116.89 mg/dL

## 2023-08-03 NOTE — Patient Instructions (Signed)
Your procedure is scheduled on: Monday 08/16/23 Report to the Registration Desk on the 1st floor of the [facility]. To find out your arrival time, please call [phone] between 1PM - 3PM on: Friday 08/13/23  If your arrival time is 6:00 am, do not arrive before that time as the [facility] entrance doors do not open until 6:00 am.  REMEMBER: Instructions that are not followed completely may result in serious medical risk, up to and including death; or upon the discretion of your surgeon and anesthesiologist your surgery may need to be rescheduled.  Do not eat food after midnight the night before surgery.  No gum chewing or hard candies.  You may however, drink CLEAR liquids up to 2 hours before you are scheduled to arrive for your surgery. Do not drink anything within 2 hours of your scheduled arrival time.  Clear liquids include: - water  - apple juice without pulp - gatorade (not RED colors) - black coffee or tea (Do NOT add milk or creamers to the coffee or tea) Do NOT drink anything that is not on this list.   In addition, your doctor has ordered for you to drink the provided:  Ensure Pre-Surgery Clear Carbohydrate Drink  Drinking this carbohydrate drink up to two hours before surgery helps to reduce insulin resistance and improve patient outcomes. Please complete drinking 2 hours before scheduled arrival time.  One week prior to surgery: Stop Anti-inflammatories (NSAIDS) such as Advil, Aleve, Ibuprofen, Motrin, Naproxen, Naprosyn and Aspirin based products such as Excedrin, Goody's Powder, BC Powder. Stop ANY OVER THE COUNTER supplements until after surgery.  You may however, continue to take Tylenol if needed for pain up until the day of surgery.   Continue taking all of your other prescription medications up until the day of surgery.  ON THE DAY OF SURGERY ONLY TAKE THESE MEDICATIONS WITH SIPS OF WATER:  levothyroxine (SYNTHROID) 150 MCG   No Alcohol for 24 hours  before or after surgery.  No Smoking including e-cigarettes for 24 hours before surgery.  No chewable tobacco products for at least 6 hours before surgery.  No nicotine patches on the day of surgery.  Do not use any "recreational" drugs for at least a week (preferably 2 weeks) before your surgery.  Please be advised that the combination of cocaine and anesthesia may have negative outcomes, up to and including death. If you test positive for cocaine, your surgery will be cancelled.  On the morning of surgery brush your teeth with toothpaste and water, you may rinse your mouth with mouthwash if you wish. Do not swallow any toothpaste or mouthwash.  Use CHG Soap or wipes as directed on instruction sheet.  Do not wear jewelry, make-up, hairpins, clips or nail polish.  For welded (permanent) jewelry: bracelets, anklets, waist bands, etc.  Please have this removed prior to surgery.  If it is not removed, there is a chance that hospital personnel will need to cut it off on the day of surgery.  Do not wear lotions, powders, or perfumes.   Do not shave body hair from the neck down 48 hours before surgery.  Contact lenses, hearing aids and dentures may not be worn into surgery.  Do not bring valuables to the hospital. [facility] is not responsible for any missing/lost belongings or valuables.   Total Shoulder Arthroplasty:  use Benzoyl Peroxide 5% Gel as directed on instruction sheet.  Bring your C-PAP to the hospital in case you may have to  spend the night.   Notify your doctor if there is any change in your medical condition (cold, fever, infection).  Wear comfortable clothing (specific to your surgery type) to the hospital.  After surgery, you can help prevent lung complications by doing breathing exercises.  Take deep breaths and cough every 1-2 hours. Your doctor may order a device called an Incentive Spirometer to help you take deep breaths. When coughing or sneezing, hold a pillow  firmly against your incision with both hands. This is called "splinting." Doing this helps protect your incision. It also decreases belly discomfort.  If you are being admitted to the hospital overnight, leave your suitcase in the car. After surgery it may be brought to your room.  In case of increased patient census, it may be necessary for you, the patient, to continue your postoperative care in the Same Day Surgery department.  If you are being discharged the day of surgery, you will not be allowed to drive home. You will need a responsible individual to drive you home and stay with you for 24 hours after surgery.   If you are taking public transportation, you will need to have a responsible individual with you.  Please call the Pre-admissions Testing Dept. at [phone] if you have any questions about these instructions.  Surgery Visitation Policy:  Patients having surgery or a procedure may have two visitors.  Children under the age of 68 must have an adult with them who is not the patient.  Temporary Visitor Restrictions Due to increasing cases of flu, RSV and COVID-19: Children ages 53 and under will not be able to visit patients in [facility] hospitals under most circumstances.  Inpatient Visitation:    Visiting hours are 7 a.m. to 8 p.m. Up to four visitors are allowed at one time in a patient room. The visitors may rotate out with other people during the day.  One visitor age 67 or older may stay with the patient overnight and must be in the room by 8 p.m.    Pre-operative 5 CHG Bath Instructions   You can play a key role in reducing the risk of infection after surgery. Your skin needs to be as free of germs as possible. You can reduce the number of germs on your skin by washing with CHG (chlorhexidine gluconate) soap before surgery. CHG is an antiseptic soap that kills germs and continues to kill germs even after washing.   DO NOT use if you have an allergy to  chlorhexidine/CHG or antibacterial soaps. If your skin becomes reddened or irritated, stop using the CHG and notify one of our RNs at [phone].   Please shower with the CHG soap starting 4 days before surgery using the following schedule:     Please keep in mind the following:  DO NOT shave, including legs and underarms, starting the day of your first shower.   You may shave your face at any point before/day of surgery.  Place clean sheets on your bed the day you start using CHG soap. Use a clean washcloth (not used since being washed) for each shower. DO NOT sleep with pets once you start using the CHG.   CHG Shower Instructions:  If you choose to wash your hair and private area, wash first with your normal shampoo/soap.  After you use shampoo/soap, rinse your hair and body thoroughly to remove shampoo/soap residue.  Turn the water OFF and apply about 3 tablespoons (45 ml) of CHG soap to a  CLEAN washcloth.  Apply CHG soap ONLY FROM YOUR NECK DOWN TO YOUR TOES (washing for 3-5 minutes)  DO NOT use CHG soap on face, private areas, open wounds, or sores.  Pay special attention to the area where your surgery is being performed.  If you are having back surgery, having someone wash your back for you may be helpful. Wait 2 minutes after CHG soap is applied, then you may rinse off the CHG soap.  Pat dry with a clean towel  Put on clean clothes/pajamas   If you choose to wear lotion, please use ONLY the CHG-compatible lotions on the back of this paper.     Additional instructions for the day of surgery: DO NOT APPLY any lotions, deodorants, cologne, or perfumes.   Put on clean/comfortable clothes.  Brush your teeth.  Ask your nurse before applying any prescription medications to the skin.      CHG Compatible Lotions   Aveeno Moisturizing lotion  Cetaphil Moisturizing Cream  Cetaphil Moisturizing Lotion  Clairol Herbal Essence Moisturizing Lotion, Dry Skin  Clairol Herbal Essence  Moisturizing Lotion, Extra Dry Skin  Clairol Herbal Essence Moisturizing Lotion, Normal Skin  Curel Age Defying Therapeutic Moisturizing Lotion with Alpha Hydroxy  Curel Extreme Care Body Lotion  Curel Soothing Hands Moisturizing Hand Lotion  Curel Therapeutic Moisturizing Cream, Fragrance-Free  Curel Therapeutic Moisturizing Lotion, Fragrance-Free  Curel Therapeutic Moisturizing Lotion, Original Formula  Eucerin Daily Replenishing Lotion  Eucerin Dry Skin Therapy Plus Alpha Hydroxy Crme  Eucerin Dry Skin Therapy Plus Alpha Hydroxy Lotion  Eucerin Original Crme  Eucerin Original Lotion  Eucerin Plus Crme Eucerin Plus Lotion  Eucerin TriLipid Replenishing Lotion  Keri Anti-Bacterial Hand Lotion  Keri Deep Conditioning Original Lotion Dry Skin Formula Softly Scented  Keri Deep Conditioning Original Lotion, Fragrance Free Sensitive Skin Formula  Keri Lotion Fast Absorbing Fragrance Free Sensitive Skin Formula  Keri Lotion Fast Absorbing Softly Scented Dry Skin Formula  Keri Original Lotion  Keri Skin Renewal Lotion Keri Silky Smooth Lotion  Keri Silky Smooth Sensitive Skin Lotion  Nivea Body Creamy Conditioning Oil  Nivea Body Extra Enriched Lotion  Nivea Body Original Lotion  Nivea Body Sheer Moisturizing Lotion Nivea Crme  Nivea Skin Firming Lotion  NutraDerm 30 Skin Lotion  NutraDerm Skin Lotion  NutraDerm Therapeutic Skin Cream  NutraDerm Therapeutic Skin Lotion  ProShield Protective Hand Cream  Provon moisturizing lotion  How to Use an Incentive Spirometer  An incentive spirometer is a tool that measures how well you are filling your lungs with each breath. Learning to take long, deep breaths using this tool can help you keep your lungs clear and active. This may help to reverse or lessen your chance of developing breathing (pulmonary) problems, especially infection. You may be asked to use a spirometer: After a surgery. If you have a lung problem or a history of  smoking. After a long period of time when you have been unable to move or be active. If the spirometer includes an indicator to show the highest number that you have reached, your health care provider or respiratory therapist will help you set a goal. Keep a log of your progress as told by your health care provider. What are the risks? Breathing too quickly may cause dizziness or cause you to pass out. Take your time so you do not get dizzy or light-headed. If you are in pain, you may need to take pain medicine before doing incentive spirometry. It is harder to take a deep breath  if you are having pain. How to use your incentive spirometer  Sit up on the edge of your bed or on a chair. Hold the incentive spirometer so that it is in an upright position. Before you use the spirometer, breathe out normally. Place the mouthpiece in your mouth. Make sure your lips are closed tightly around it. Breathe in slowly and as deeply as you can through your mouth, causing the piston or the ball to rise toward the top of the chamber. Hold your breath for 3-5 seconds, or for as long as possible. If the spirometer includes a coach indicator, use this to guide you in breathing. Slow down your breathing if the indicator goes above the marked areas. Remove the mouthpiece from your mouth and breathe out normally. The piston or ball will return to the bottom of the chamber. Rest for a few seconds, then repeat the steps 10 or more times. Take your time and take a few normal breaths between deep breaths so that you do not get dizzy or light-headed. Do this every 1-2 hours when you are awake. If the spirometer includes a goal marker to show the highest number you have reached (best effort), use this as a goal to work toward during each repetition. After each set of 10 deep breaths, cough a few times. This will help to make sure that your lungs are clear. If you have an incision on your chest or abdomen from surgery,  place a pillow or a rolled-up towel firmly against the incision when you cough. This can help to reduce pain while taking deep breaths and coughing. General tips When you are able to get out of bed: Walk around often. Continue to take deep breaths and cough in order to clear your lungs. Keep using the incentive spirometer until your health care provider says it is okay to stop using it. If you have been in the hospital, you may be told to keep using the spirometer at home. Contact a health care provider if: You are having difficulty using the spirometer. You have trouble using the spirometer as often as instructed. Your pain medicine is not giving enough relief for you to use the spirometer as told. You have a fever. Get help right away if: You develop shortness of breath. You develop a cough with bloody mucus from the lungs. You have fluid or blood coming from an incision site after you cough. Summary An incentive spirometer is a tool that can help you learn to take long, deep breaths to keep your lungs clear and active. You may be asked to use a spirometer after a surgery, if you have a lung problem or a history of smoking, or if you have been inactive for a long period of time. Use your incentive spirometer as instructed every 1-2 hours while you are awake. If you have an incision on your chest or abdomen, place a pillow or a rolled-up towel firmly against your incision when you cough. This will help to reduce pain. Get help right away if you have shortness of breath, you cough up bloody mucus, or blood comes from your incision when you cough. This information is not intended to replace advice given to you by your health care provider. Make sure you discuss any questions you have with your health care provider. Document Revised: 09/18/2019 Document Reviewed: 09/18/2019 Elsevier Patient Education  2023 [employer].    Please go to the following website to access important education  materials concerning your  upcoming joint replacement.                                   [email or web address]

## 2023-08-15 MED ORDER — CEFAZOLIN SODIUM-DEXTROSE 3-4 GM/150ML-% IV SOLN
3.0000 g | INTRAVENOUS | Status: AC
  Administered 2023-08-16: 3 g via INTRAVENOUS
  Filled 2023-08-15: qty 150

## 2023-08-15 MED ORDER — ORAL CARE MOUTH RINSE: 15.0000 mL | Freq: Once | OROMUCOSAL | Status: AC

## 2023-08-15 MED ORDER — DEXAMETHASONE SODIUM PHOSPHATE 10 MG/ML IJ SOLN
8.0000 mg | Freq: Once | INTRAMUSCULAR | Status: AC
  Administered 2023-08-16: 10 mg via INTRAVENOUS

## 2023-08-15 MED ORDER — CHLORHEXIDINE GLUCONATE 0.12 % MT SOLN
15.0000 mL | Freq: Once | OROMUCOSAL | Status: AC
  Administered 2023-08-16: 15 mL via OROMUCOSAL

## 2023-08-15 MED ORDER — LACTATED RINGERS IV SOLN: INTRAVENOUS | Status: DC

## 2023-08-15 MED ORDER — TRANEXAMIC ACID-NACL 1000-0.7 MG/100ML-% IV SOLN
1000.0000 mg | INTRAVENOUS | Status: AC
  Administered 2023-08-16 (×2): 1000 mg via INTRAVENOUS

## 2023-08-16 ENCOUNTER — Encounter
Admission: RE | Disposition: A | Discharge status: Home Health | Payer: Self-pay | Source: Home / Self Care | Attending: Orthopedic Surgery

## 2023-08-16 ENCOUNTER — Other Ambulatory Visit: Payer: Self-pay

## 2023-08-16 ENCOUNTER — Ambulatory Visit: Payer: Medicare HMO

## 2023-08-16 ENCOUNTER — Ambulatory Visit: Payer: Medicare HMO | Admitting: Certified Registered"

## 2023-08-16 ENCOUNTER — Ambulatory Visit
Admission: RE | Admit: 2023-08-16 | Discharge: 2023-08-17 | Disposition: A | Discharge status: Home Health | Payer: Medicare HMO | Attending: Orthopedic Surgery | Admitting: Orthopedic Surgery

## 2023-08-16 ENCOUNTER — Encounter: Payer: Self-pay | Admitting: Orthopedic Surgery

## 2023-08-16 ENCOUNTER — Ambulatory Visit: Payer: Medicare HMO | Admitting: Urgent Care

## 2023-08-16 DIAGNOSIS — Z87891 Personal history of nicotine dependence: Secondary | ICD-10-CM | POA: Diagnosis not present

## 2023-08-16 DIAGNOSIS — M21061 Valgus deformity, not elsewhere classified, right knee: Secondary | ICD-10-CM | POA: Diagnosis not present

## 2023-08-16 DIAGNOSIS — Z96651 Presence of right artificial knee joint: Secondary | ICD-10-CM

## 2023-08-16 DIAGNOSIS — M1711 Unilateral primary osteoarthritis, right knee: Secondary | ICD-10-CM | POA: Diagnosis present

## 2023-08-16 DIAGNOSIS — Z96643 Presence of artificial hip joint, bilateral: Secondary | ICD-10-CM | POA: Insufficient documentation

## 2023-08-16 DIAGNOSIS — E039 Hypothyroidism, unspecified: Secondary | ICD-10-CM | POA: Insufficient documentation

## 2023-08-16 DIAGNOSIS — Z79890 Hormone replacement therapy (postmenopausal): Secondary | ICD-10-CM | POA: Diagnosis not present

## 2023-08-16 DIAGNOSIS — J45909 Unspecified asthma, uncomplicated: Secondary | ICD-10-CM | POA: Insufficient documentation

## 2023-08-16 HISTORY — PX: TOTAL KNEE ARTHROPLASTY: SHX125

## 2023-08-16 SURGERY — ARTHROPLASTY, KNEE, TOTAL
Anesthesia: Spinal | Site: Knee | Laterality: Right

## 2023-08-16 MED ORDER — ACETAMINOPHEN 325 MG PO TABS: 325.0000 mg | ORAL_TABLET | Freq: Four times a day (QID) | ORAL | Status: DC | PRN

## 2023-08-16 MED ORDER — SODIUM CHLORIDE 0.9 % IR SOLN
Status: DC | PRN
  Administered 2023-08-16: 2000 mL

## 2023-08-16 MED ORDER — CEFAZOLIN SODIUM-DEXTROSE 2-4 GM/100ML-% IV SOLN
2.0000 g | Freq: Four times a day (QID) | INTRAVENOUS | Status: AC
  Administered 2023-08-16 (×2): 2 g via INTRAVENOUS
  Filled 2023-08-16 (×2): qty 100

## 2023-08-16 MED ORDER — ONDANSETRON HCL 4 MG/2ML IJ SOLN
INTRAMUSCULAR | Status: DC | PRN
  Administered 2023-08-16: 4 mg via INTRAVENOUS

## 2023-08-16 MED ORDER — FENTANYL CITRATE (PF) 100 MCG/2ML IJ SOLN
INTRAMUSCULAR | Status: AC
  Filled 2023-08-16: qty 2

## 2023-08-16 MED ORDER — CHLORHEXIDINE GLUCONATE 0.12 % MT SOLN
OROMUCOSAL | Status: AC
  Filled 2023-08-16: qty 15

## 2023-08-16 MED ORDER — PHENYLEPHRINE HCL-NACL 20-0.9 MG/250ML-% IV SOLN
INTRAVENOUS | Status: DC | PRN
  Administered 2023-08-16: 160 ug via INTRAVENOUS
  Administered 2023-08-16: 10 ug/min via INTRAVENOUS

## 2023-08-16 MED ORDER — ACETAMINOPHEN 10 MG/ML IV SOLN
INTRAVENOUS | Status: DC | PRN
  Administered 2023-08-16: 1000 mg via INTRAVENOUS

## 2023-08-16 MED ORDER — HYDROCODONE-ACETAMINOPHEN 5-325 MG PO TABS
1.0000 | ORAL_TABLET | ORAL | Status: DC | PRN
  Administered 2023-08-16 – 2023-08-17 (×4): 2 via ORAL
  Administered 2023-08-17 (×2): 1 via ORAL
  Filled 2023-08-16: qty 2
  Filled 2023-08-16: qty 1
  Filled 2023-08-16 (×3): qty 2
  Filled 2023-08-16: qty 1
  Filled 2023-08-16: qty 2

## 2023-08-16 MED ORDER — PHENOL 1.4 % MT LIQD: 1.0000 | OROMUCOSAL | Status: DC | PRN

## 2023-08-16 MED ORDER — ENOXAPARIN SODIUM 30 MG/0.3ML IJ SOSY
30.0000 mg | PREFILLED_SYRINGE | Freq: Two times a day (BID) | INTRAMUSCULAR | Status: DC
  Administered 2023-08-17: 30 mg via SUBCUTANEOUS

## 2023-08-16 MED ORDER — TRAMADOL HCL 50 MG PO TABS
50.0000 mg | ORAL_TABLET | Freq: Four times a day (QID) | ORAL | Status: DC | PRN
  Administered 2023-08-16 – 2023-08-17 (×3): 50 mg via ORAL
  Filled 2023-08-16 (×3): qty 1

## 2023-08-16 MED ORDER — PROPOFOL 1000 MG/100ML IV EMUL
INTRAVENOUS | Status: AC
  Filled 2023-08-16: qty 100

## 2023-08-16 MED ORDER — ONDANSETRON HCL 4 MG/2ML IJ SOLN: 4.0000 mg | Freq: Four times a day (QID) | INTRAMUSCULAR | Status: DC | PRN

## 2023-08-16 MED ORDER — OXYCODONE HCL 5 MG/5ML PO SOLN: 5.0000 mg | Freq: Once | ORAL | Status: AC | PRN

## 2023-08-16 MED ORDER — FENTANYL CITRATE (PF) 100 MCG/2ML IJ SOLN
25.0000 ug | INTRAMUSCULAR | Status: DC | PRN
  Administered 2023-08-16: 25 ug via INTRAVENOUS

## 2023-08-16 MED ORDER — ACETAMINOPHEN 500 MG PO TABS: 1000.0000 mg | ORAL_TABLET | Freq: Three times a day (TID) | ORAL | Status: DC

## 2023-08-16 MED ORDER — MENTHOL 3 MG MT LOZG: 1.0000 | LOZENGE | OROMUCOSAL | Status: DC | PRN

## 2023-08-16 MED ORDER — LIDOCAINE HCL URETHRAL/MUCOSAL 2 % EX GEL
CUTANEOUS | Status: DC | PRN
  Administered 2023-08-16: 1 via TOPICAL

## 2023-08-16 MED ORDER — PANTOPRAZOLE SODIUM 40 MG PO TBEC
40.0000 mg | DELAYED_RELEASE_TABLET | Freq: Every day | ORAL | Status: DC
  Administered 2023-08-16 – 2023-08-17 (×2): 40 mg via ORAL
  Filled 2023-08-16 (×2): qty 1

## 2023-08-16 MED ORDER — OXYCODONE HCL 5 MG PO TABS
ORAL_TABLET | ORAL | Status: AC
  Filled 2023-08-16: qty 1

## 2023-08-16 MED ORDER — FENTANYL CITRATE (PF) 100 MCG/2ML IJ SOLN
INTRAMUSCULAR | Status: DC | PRN
  Administered 2023-08-16 (×2): 50 ug via INTRAVENOUS

## 2023-08-16 MED ORDER — BUPIVACAINE HCL (PF) 0.5 % IJ SOLN
INTRAMUSCULAR | Status: DC | PRN
  Administered 2023-08-16: 3 mL

## 2023-08-16 MED ORDER — KETOROLAC TROMETHAMINE 15 MG/ML IJ SOLN
7.5000 mg | Freq: Four times a day (QID) | INTRAMUSCULAR | Status: AC
  Administered 2023-08-16 – 2023-08-17 (×4): 7.5 mg via INTRAVENOUS
  Filled 2023-08-16 (×4): qty 1

## 2023-08-16 MED ORDER — SODIUM CHLORIDE (PF) 0.9 % IJ SOLN
INTRAMUSCULAR | Status: DC | PRN
  Administered 2023-08-16: 71 mL via INTRAMUSCULAR

## 2023-08-16 MED ORDER — PROPOFOL 500 MG/50ML IV EMUL
INTRAVENOUS | Status: DC | PRN
  Administered 2023-08-16: 125 ug/kg/min via INTRAVENOUS

## 2023-08-16 MED ORDER — SODIUM CHLORIDE (PF) 0.9 % IJ SOLN
INTRAMUSCULAR | Status: AC
  Filled 2023-08-16: qty 20

## 2023-08-16 MED ORDER — SODIUM CHLORIDE 0.9 % IV SOLN: INTRAVENOUS | Status: DC

## 2023-08-16 MED ORDER — DOCUSATE SODIUM 100 MG PO CAPS
100.0000 mg | ORAL_CAPSULE | Freq: Two times a day (BID) | ORAL | Status: DC
  Administered 2023-08-16 – 2023-08-17 (×3): 100 mg via ORAL
  Filled 2023-08-16 (×3): qty 1

## 2023-08-16 MED ORDER — TRANEXAMIC ACID-NACL 1000-0.7 MG/100ML-% IV SOLN
INTRAVENOUS | Status: AC
  Filled 2023-08-16: qty 100

## 2023-08-16 MED ORDER — ONDANSETRON HCL 4 MG PO TABS: 4.0000 mg | ORAL_TABLET | Freq: Four times a day (QID) | ORAL | Status: DC | PRN

## 2023-08-16 MED ORDER — BUPIVACAINE LIPOSOME 1.3 % IJ SUSP
INTRAMUSCULAR | Status: AC
  Filled 2023-08-16: qty 20

## 2023-08-16 MED ORDER — CHLORTHALIDONE 25 MG PO TABS
25.0000 mg | ORAL_TABLET | Freq: Every day | ORAL | Status: DC
  Administered 2023-08-16 – 2023-08-17 (×2): 25 mg via ORAL
  Filled 2023-08-16 (×2): qty 1

## 2023-08-16 MED ORDER — IRBESARTAN 150 MG PO TABS
150.0000 mg | ORAL_TABLET | Freq: Every day | ORAL | Status: DC
Start: 2023-08-16 — End: 2023-08-17
  Administered 2023-08-16: 150 mg via ORAL
  Filled 2023-08-16: qty 1

## 2023-08-16 MED ORDER — SURGIRINSE WOUND IRRIGATION SYSTEM - OPTIME
TOPICAL | Status: DC | PRN
  Administered 2023-08-16: 450 mL

## 2023-08-16 MED ORDER — OXYCODONE HCL 5 MG PO TABS
5.0000 mg | ORAL_TABLET | Freq: Once | ORAL | Status: AC | PRN
  Administered 2023-08-16: 5 mg via ORAL

## 2023-08-16 MED ORDER — BUPIVACAINE-EPINEPHRINE (PF) 0.25% -1:200000 IJ SOLN
INTRAMUSCULAR | Status: AC
  Filled 2023-08-16: qty 30

## 2023-08-16 MED ORDER — METOCLOPRAMIDE HCL 10 MG PO TABS
5.0000 mg | ORAL_TABLET | Freq: Three times a day (TID) | ORAL | Status: DC | PRN
Start: 2023-08-16 — End: 2023-08-17

## 2023-08-16 MED ORDER — MORPHINE SULFATE (PF) 2 MG/ML IV SOLN
0.5000 mg | INTRAVENOUS | Status: DC | PRN
Start: 2023-08-16 — End: 2023-08-17
  Administered 2023-08-16: 1 mg via INTRAVENOUS
  Filled 2023-08-16: qty 1

## 2023-08-16 MED ORDER — METOCLOPRAMIDE HCL 5 MG/ML IJ SOLN: 5.0000 mg | Freq: Three times a day (TID) | INTRAMUSCULAR | Status: DC | PRN

## 2023-08-16 MED ORDER — GABAPENTIN 300 MG PO CAPS
300.0000 mg | ORAL_CAPSULE | Freq: Every day | ORAL | Status: DC
  Administered 2023-08-16: 300 mg via ORAL
  Filled 2023-08-16: qty 3

## 2023-08-16 MED ORDER — LEVOTHYROXINE SODIUM 25 MCG PO TABS
150.0000 ug | ORAL_TABLET | Freq: Every day | ORAL | Status: DC
Start: 2023-08-17 — End: 2023-08-17
  Administered 2023-08-17: 150 ug via ORAL
  Filled 2023-08-16: qty 6

## 2023-08-16 SURGICAL SUPPLY — 71 items
BLADE PATELLA REAM PILOT HOLE (MISCELLANEOUS) IMPLANT
BLADE SAGITTAL AGGR TOOTH XLG (BLADE) IMPLANT
BLADE SAW 90X13X1.19 OSCILLAT (BLADE) IMPLANT
BLADE SAW SAG 25X90X1.19 (BLADE) ×1 IMPLANT
BLADE SAW SAG 29X58X.64 (BLADE) ×1 IMPLANT
BNDG ELASTIC 6INX 5YD STR LF (GAUZE/BANDAGES/DRESSINGS) ×1 IMPLANT
BOWL CEMENT MIX W/ADAPTER (MISCELLANEOUS) ×1 IMPLANT
BRUSH SCRUB EZ PLAIN DRY (MISCELLANEOUS) ×1 IMPLANT
CEMENT BONE R 1X40 (Cement) ×2 IMPLANT
CHLORAPREP W/TINT 26 (MISCELLANEOUS) ×2 IMPLANT
COMP FEM PERSONA SZ9 RT (Joint) ×1 IMPLANT
COMPONENT FEM PERSONA SZ9 RT (Joint) IMPLANT
COOLER POLAR GLACIER W/PUMP (MISCELLANEOUS) ×1 IMPLANT
CUFF TRNQT CYL 24X4X16.5-23 (TOURNIQUET CUFF) IMPLANT
CUFF TRNQT CYL 30X4X21-28X (TOURNIQUET CUFF) IMPLANT
DERMABOND ADVANCED .7 DNX12 (GAUZE/BANDAGES/DRESSINGS) ×1 IMPLANT
DRAPE SHEET LG 3/4 BI-LAMINATE (DRAPES) ×1 IMPLANT
DRSG MEPILEX SACRM 8.7X9.8 (GAUZE/BANDAGES/DRESSINGS) ×1 IMPLANT
DRSG OPSITE POSTOP 4X10 (GAUZE/BANDAGES/DRESSINGS) IMPLANT
DRSG OPSITE POSTOP 4X8 (GAUZE/BANDAGES/DRESSINGS) IMPLANT
ELECT REM PT RETURN 9FT ADLT (ELECTROSURGICAL) ×1
ELECTRODE REM PT RTRN 9FT ADLT (ELECTROSURGICAL) ×1 IMPLANT
GLOVE BIO SURGEON STRL SZ8 (GLOVE) ×1 IMPLANT
GLOVE BIOGEL PI IND STRL 8 (GLOVE) ×1 IMPLANT
GLOVE PI ORTHO PRO STRL 7.5 (GLOVE) ×2 IMPLANT
GLOVE PI ORTHO PRO STRL SZ8 (GLOVE) ×2 IMPLANT
GLOVE SURG SYN 7.5 E (GLOVE) ×1 IMPLANT
GLOVE SURG SYN 7.5 PF PI (GLOVE) ×1 IMPLANT
GOWN SRG XL LVL 3 NONREINFORCE (GOWNS) ×1 IMPLANT
GOWN STRL REUS W/ TWL LRG LVL3 (GOWN DISPOSABLE) ×1 IMPLANT
GOWN STRL REUS W/ TWL XL LVL3 (GOWN DISPOSABLE) ×1 IMPLANT
HOOD PEEL AWAY T7 (MISCELLANEOUS) ×2 IMPLANT
INSERT PS KNEE EF/6-9 13 RT (Insert) IMPLANT
IV NS IRRIG 3000ML ARTHROMATIC (IV SOLUTION) ×1 IMPLANT
KIT TURNOVER KIT A (KITS) ×1 IMPLANT
MANIFOLD NEPTUNE II (INSTRUMENTS) ×1 IMPLANT
MARKER SKIN DUAL TIP RULER LAB (MISCELLANEOUS) ×1 IMPLANT
MAT ABSORB FLUID 56X50 GRAY (MISCELLANEOUS) ×1 IMPLANT
NDL HYPO 21X1.5 SAFETY (NEEDLE) ×1 IMPLANT
NEEDLE HYPO 21X1.5 SAFETY (NEEDLE) ×1 IMPLANT
PACK TOTAL KNEE (MISCELLANEOUS) ×1 IMPLANT
PAD ARMBOARD 7.5X6 YLW CONV (MISCELLANEOUS) ×3 IMPLANT
PAD IMPACTOR FEM PS DISP (ORTHOPEDIC DISPOSABLE SUPPLIES) IMPLANT
PAD WRAPON POLAR KNEE (MISCELLANEOUS) ×1 IMPLANT
PENCIL SMOKE EVACUATOR (MISCELLANEOUS) ×1 IMPLANT
PIN DRILL HDLS TROCAR 75 4PK (PIN) IMPLANT
PULSAVAC PLUS IRRIG FAN TIP (DISPOSABLE) ×1
SCREW FEMALE HEX FIX 25X2.5 (ORTHOPEDIC DISPOSABLE SUPPLIES) IMPLANT
SCREW HEX HEADED 3.5X27 DISP (ORTHOPEDIC DISPOSABLE SUPPLIES) IMPLANT
SLEEVE SCD COMPRESS KNEE MED (STOCKING) ×1 IMPLANT
SOLUTION IRRIG SURGIPHOR (IV SOLUTION) ×1 IMPLANT
STEM POLY PAT PLY 29M KNEE (Knees) IMPLANT
STEM TIB ST PERS 14+30 (Stem) IMPLANT
STEM TIBIA 5 DEG SZ E R KNEE (Knees) IMPLANT
SUT STRATA 1 CT-1 DLB (SUTURE) ×1
SUT STRATAFIX 14 PDO 48 VLT (SUTURE) ×1 IMPLANT
SUT STRATAFIX PDO 1 14 VIOLET (SUTURE) ×1 IMPLANT
SUT VIC AB 0 CT1 36 (SUTURE) ×1 IMPLANT
SUT VIC AB 2-0 CT2 27 (SUTURE) ×2 IMPLANT
SUT VICRYL 1-0 27IN ABS (SUTURE) ×1
SUTURE STRATA 1 CT-1 DLB (SUTURE) ×1 IMPLANT
SUTURE STRATA SPIR 4-0 18 (SUTURE) ×1 IMPLANT
SUTURE VICRYL 1-0 27IN ABS (SUTURE) ×1 IMPLANT
SYR 30ML LL (SYRINGE) ×2 IMPLANT
TAPE CLOTH 3X10 WHT NS LF (GAUZE/BANDAGES/DRESSINGS) ×1 IMPLANT
TIBIA STEM 5 DEG SZ E R KNEE (Knees) ×1 IMPLANT
TIP FAN IRRIG PULSAVAC PLUS (DISPOSABLE) ×1 IMPLANT
TOWEL OR 17X26 4PK STRL BLUE (TOWEL DISPOSABLE) IMPLANT
TRAP FLUID SMOKE EVACUATOR (MISCELLANEOUS) ×1 IMPLANT
WATER STERILE IRR 1000ML POUR (IV SOLUTION) ×1 IMPLANT
WRAPON POLAR PAD KNEE (MISCELLANEOUS) ×1

## 2023-08-16 NOTE — Evaluation (Signed)
Physical Therapy Evaluation Patient Details Name: Dawn Lynch MRN: [id number] DOB: July 01, 1951 Today's Date: 08/16/2023  History of Present Illness  Patient is a 73 year old female with right knee osteoarthritis s/p right total knee arthroplasty. History of bilateral hip replacements.  Clinical Impression  Patient is agreeable to PT evaluation. She walks without assistive device at baseline. She lives with her spouse. They have a ramped entrance to the home if needed.  The patient was already seated in the chair on arrival to room today. She reports 7/10 R knee pain. Gait training initiated. Occasional cues for rolling walker and BLE sequencing. Patient educated on positioning of right leg to promote knee ROM and exercise handout provided. She is hopeful for discharge home tomorrow. Recommend to continue PT to maximize independence and decrease caregiver burden.       If plan is discharge home, recommend the following: Assist for transportation;Help with stairs or ramp for entrance   Can travel by private vehicle        Equipment Recommendations BSC/3in1  Recommendations for Other Services       Functional Status Assessment Patient has had a recent decline in their functional status and demonstrates the ability to make significant improvements in function in a reasonable and predictable amount of time.     Precautions / Restrictions Precautions Precautions: Knee;Fall Precaution Booklet Issued: Yes (comment) Restrictions Weight Bearing Restrictions Per Provider Order: Yes RLE Weight Bearing Per Provider Order: Weight bearing as tolerated      Mobility  Bed Mobility               General bed mobility comments: not observed as patient sitting up on arrival and post session    Transfers Overall transfer level: Needs assistance Equipment used: Rolling walker (2 wheels) Transfers: Sit to/from Stand Sit to Stand: Supervision           General transfer comment:  verbal cues for RLE positioning with sitting for comfort    Ambulation/Gait Ambulation/Gait assistance: Contact guard assist Gait Distance (Feet): 120 Feet Assistive device: Rolling walker (2 wheels) Gait Pattern/deviations: Step-through pattern, Step-to pattern, Decreased stance time - right, Antalgic Gait velocity: decreased     General Gait Details: reinforcement of BLE and rolling walker sequencing. cues for heel contact with RLE. patient fatigued towards the end of the walk  Stairs            Wheelchair Mobility     Tilt Bed    Modified Rankin (Stroke Patients Only)       Balance Overall balance assessment: Needs assistance Sitting-balance support: Feet supported Sitting balance-Leahy Scale: Fair     Standing balance support: Bilateral upper extremity supported Standing balance-Leahy Scale: Fair Standing balance comment: rolling walker for safety with standing                             Pertinent Vitals/Pain Pain Assessment Pain Assessment: 0-10 Pain Score: 7  Pain Location: R knee Pain Descriptors / Indicators: Discomfort Pain Intervention(s): Limited activity within patient's tolerance, Monitored during session, Repositioned, Premedicated before session (encouraged polar care)    Home Living Family/patient expects to be discharged to:: Private residence Living Arrangements: Spouse/significant other Available Help at Discharge: Family Type of Home: House Home Access: Stairs to enter (also has a ramp if needed) Entrance Stairs-Rails: Right;Left Entrance Stairs-Number of Steps: 5   Home Layout: Two level;Able to live on main level with bedroom/bathroom Home Equipment: Rolling  Walker (2 wheels);Hospital bed      Prior Function Prior Level of Function : Independent/Modified Independent             Mobility Comments: patient was ambulatory without device. she reports having to climb up the stairs to get to her upstairs bedroom        Extremity/Trunk Assessment   Upper Extremity Assessment Upper Extremity Assessment: Overall WFL for tasks assessed    Lower Extremity Assessment Lower Extremity Assessment: RLE deficits/detail RLE Deficits / Details: patient is able to complete SLR independently. weight bearing without knee buckling RLE Sensation: WNL       Communication   Communication Communication: No apparent difficulties  Cognition Arousal: Alert Behavior During Therapy: WFL for tasks assessed/performed Overall Cognitive Status: Within Functional Limits for tasks assessed                                          General Comments      Exercises Total Joint Exercises Ankle Circles/Pumps: AROM, Strengthening, 10 reps, Seated, Both Straight Leg Raises: AROM, Strengthening, Right, 5 reps, Seated Goniometric ROM: R knee flexion 91 degrees measured in sitting position   Assessment/Plan    PT Assessment Patient needs continued PT services  PT Problem List Decreased strength;Decreased range of motion;Decreased activity tolerance;Decreased balance;Decreased mobility;Decreased safety awareness;Pain       PT Treatment Interventions DME instruction;Gait training;Stair training;Functional mobility training;Therapeutic activities;Therapeutic exercise;Balance training    PT Goals (Current goals can be found in the Care Plan section)  Acute Rehab PT Goals Patient Stated Goal: to go home PT Goal Formulation: With patient Time For Goal Achievement: 08/30/23 Potential to Achieve Goals: Good    Frequency BID     Co-evaluation               AM-PAC PT "6 Clicks" Mobility  Outcome Measure Help needed turning from your back to your side while in a flat bed without using bedrails?: A Little Help needed moving from lying on your back to sitting on the side of a flat bed without using bedrails?: A Little Help needed moving to and from a bed to a chair (including a wheelchair)?: A  Little Help needed standing up from a chair using your arms (e.g., wheelchair or bedside chair)?: A Little Help needed to walk in hospital room?: A Little Help needed climbing 3-5 steps with a railing? : A Little 6 Click Score: 18    End of Session Equipment Utilized During Treatment: Gait belt Activity Tolerance: Patient tolerated treatment well Patient left: in chair;with call bell/phone within reach;with family/visitor present (polar care in place RLE) Nurse Communication: Mobility status PT Visit Diagnosis: Difficulty in walking, not elsewhere classified (R26.2);Other abnormalities of gait and mobility (R26.89)    Time: 1610-9604 PT Time Calculation (min) (ACUTE ONLY): 28 min   Charges:   PT Evaluation $PT Eval Low Complexity: 1 Low PT Treatments $Gait Training: 8-22 mins PT General Charges $$ ACUTE PT VISIT: 1 Visit         Donna Bernard, PT, MPT   Ina Homes 08/16/2023, 2:53 PM

## 2023-08-16 NOTE — Progress Notes (Signed)
 Patient is not able to walk the distance required to go the bathroom, or he/she is unable to safely negotiate stairs required to access the bathroom.  A 3in1 BSC will alleviate this problem

## 2023-08-16 NOTE — Interval H&P Note (Signed)
Patient history and physical updated. Consent reviewed including risks, benefits, and alternatives to surgery. Patient agrees with above plan to proceed with right total knee arthroplasty  

## 2023-08-16 NOTE — Anesthesia Procedure Notes (Signed)
Spinal  Patient location during procedure: OR Start time: 08/16/2023 7:31 AM End time: 08/16/2023 7:39 AM Reason for block: surgical anesthesia Staffing Performed: resident/CRNA  Performed by: Maryla Morrow., CRNA Authorized by: Rosaria Ferries, MD   Preanesthetic Checklist Completed: patient identified, IV checked, site marked, risks and benefits discussed, surgical consent, monitors and equipment checked, pre-op evaluation and timeout performed Spinal Block Patient position: sitting Prep: Betadine Patient monitoring: heart rate, continuous pulse ox, blood pressure and cardiac monitor Approach: midline Location: L2-3 Injection technique: single-shot Needle Needle type: Whitacre and Introducer  Needle gauge: 24 G Needle length: 9 cm Assessment Events: CSF return Additional Notes Negative paresthesia. Negative blood return. Positive free-flowing CSF. Expiration date of kit checked and confirmed. Patient tolerated procedure well, without complications.

## 2023-08-16 NOTE — H&P (Signed)
History of Present Illness: The patient is an 73 y.o. female seen in clinic today for history and physical for right total knee arthroplasty with Dr. Audelia Acton on 08/16/2023. Patient has advanced right knee osteoarthritis with severe valgus deformity. Pain is 10 out of 10. Pain has been increasing over the last several years with more frequent instability. She has pain along the medial and lateral joint line. She has had very little improvement with meloxicam, bracing, injections. Pain is interfering with her quality of life and activities daily living.  Patient is a non-smoker, nondiabetic with a BMI of 40  Past Medical History: Past Medical History:  Diagnosis Date  Arthritis  Carpal tunnel syndrome of right wrist  Hypertension  Kidney stones  Osteoarthritis  PONV (postoperative nausea and vomiting)  nausea x 1- hip surgery  Poor intravenous access  Thyroid disease 1993  hypothyroidism   Past Surgical History: Past Surgical History:  Procedure Laterality Date  CYSTOURETHROSCOPY W/INSERTION/EXCHANGE URETERAL STENT Left 01/02/2014  Procedure: Cystoscopy/Left Ureteral Stent Placement; Surgeon: Thornell Sartorius, MD; Location: [facility] OR; Service: Urology; Laterality: Left;  INTRAOPERATIVE FLUOROSCOPY Left 01/02/2014  Procedure: INTRAOPERATIVE FLOUROSCOPY; Surgeon: Thornell Sartorius, MD; Location: [facility] OR; Service: Urology; Laterality: Left;  LITHOTRIPSY ESWL Left 01/05/2014  Procedure: Left Ureteral Extracorporeal Shock Wave Lithotrispy; Surgeon: Thornell Sartorius, MD; Location: [facility] OR; Service: Urology; Laterality: Left;  PR CYSTO/URETERO W/LITHOTRIPSY &INDWELL STENT INSRT Left 01/15/2014  Procedure: Cystoscopy/Left Ureteroscopy with Holmium Laser-Basket Stone Extraction/Left JJ Stent Exchange; Surgeon: Toney Reil, MD; Location: [facility] OR; Service: Urology; Laterality: Left;  INTRAOPERATIVE FLUOROSCOPY Left 01/15/2014  Procedure: FLUOROSCOPE EXAM >1 HR EXTENSIVE; Surgeon: Toney Reil, MD; Location:  [facility] OR; Service: Urology; Laterality: Left;  PR CYSTO/URETERO W/LITHOTRIPSY &INDWELL STENT INSRT Left 05/27/2015  Procedure: Cystoscopy/Left Ureteroscopy with Holmium Laser-Basket Stone Extraction/Left Retrograde Pyelogram/Left JJ Stent; Surgeon: Jac Canavan, MD; Location: [facility] OR; Service: Urology; Laterality: Left;  INTRAOPERATIVE FLUOROSCOPY Left 05/27/2015  Procedure: FLUOROSCOPY, PHYSICIAN OR OTHER QUALIFIED HEALTH CARE PROFESSIONAL TIME MORE THAN 1 HOUR, ASSISTING A NONRADIOLOGIC PHYSICIAN OR OTHER QUALIFIED HEALTH CARE PROFESSIONAL; Surgeon: Jac Canavan, MD; Location: [facility] OR; Service: Urology; Laterality: Left;  Bilateral hip replacements  BLEPHAROPLASTY  CHOLECYSTECTOMY  foot surgery Right approx. 8 years ago  HERNIA REPAIR  JOINT REPLACEMENT Bilateral 2003 and 2004  hip  TONSILLECTOMY   Past Family History: Family History  Problem Relation Age of Onset  Lung cancer Mother  Arthritis Mother  Osteoporosis (Thinning of bones) Mother  High blood pressure (Hypertension) Mother  Osteoarthritis Mother  Ulcers Mother  Thyroid disease Mother  Aneurysm Mother  Cancer Mother  Colon cancer Neg Hx  Colon polyps Neg Hx   Medications: Current Outpatient Medications  Medication Sig Dispense Refill  chlorthalidone 25 MG tablet TAKE 1 TABLET BY MOUTH EVERY DAY 90 tablet 1  gabapentin (NEURONTIN) 600 MG tablet Take 1 tablet (600 mg total) by mouth once daily as needed (pain) Occasionally as needed 90 tablet 1  levothyroxine (SYNTHROID) 150 MCG tablet Take 1 tablet (150 mcg total) by mouth once daily TAKE ON AN EMPTY STOMACH WITH A GLASS OF WATER AT LEAST 30-60 MINUTES BEFORE BREAKFAST. 90 tablet 1  meloxicam (MOBIC) 7.5 MG tablet Take 1 tablet by mouth daily  olmesartan (BENICAR) 40 MG tablet Take 1 tablet (40 mg total) by mouth once daily 90 tablet 3  RYBELSUS 14 mg tablet TAKE 1 TABLET (14 MG TOTAL) BY MOUTH ONCE DAILY DO NOT CUT, CRUSH, OR CHEW 30 tablet 3   No  current facility-administered medications for this visit.   Allergies: No Known Allergies   Visit Vitals: There were no vitals filed for this visit.   Review of Systems:  A comprehensive 14 point ROS was performed, reviewed, and the pertinent orthopaedic findings are documented in the HPI.  Physical Exam: General:  Well developed, well nourished, no apparent distress, normal affect, antalgic gait with no assistive device. Valgus thrust of the right knee.  HEENT: Head normocephalic, atraumatic, PERRL.   Abdomen: Soft, non tender, non distended, Bowel sounds present.  Heart: Examination of the heart reveals regular, rate, and rhythm. There is no murmur noted on ascultation. There is a normal apical pulse.  Lungs: Lungs are clear to auscultation. There is no wheeze, rhonchi, or crackles. There is normal expansion of bilateral chest walls.   Comprehensive Knee Exam: Gait Antalgic  Alignment Valgus on the right   Inspection Right Left  Skin Normal appearance with no obvious deformity. No ecchymosis or erythema. Normal appearance with no obvious deformity. No ecchymosis or erythema.  Soft Tissue No focal soft tissue swelling No focal soft tissue swelling  Quad Atrophy None None   Palpation  Right Left  Tenderness Lateral joint line tenderness palpation Joint line tenderness palpation  Crepitus + patellofemoral and tibiofemoral crepitus + patellofemoral and tibiofemoral crepitus  Effusion None None   Range of Motion Right Left  Flexion 0-115 0-115  Extension Full knee extension without hyperextension Full knee extension without hyperextension   Ligamentous Exam Right Left  Lachman Normal Normal  Valgus 0 Sloppy endpoint Normal  Valgus 30 Normal Normal  Varus 0 Partially correctable valgus deformity with endpoint Normal  Varus 30 Normal Normal  Anterior Drawer Normal Normal  Posterior Drawer Normal Normal   Meniscal Exam Right Left  Hyperflexion Test Positive  Positive  Hyperextension Test Positive Positive  McMurray's Negative Negative    Neurovascular Right Left  Quadriceps Strength 5/5 5/5  Hamstring Strength 5/5 5/5  Hip Abductor Strength 4/5 4/5  Distal Motor Normal Normal  Distal Sensory Normal light touch sensation Normal light touch sensation  Distal Pulses Normal Normal    Imaging Studies: I have reviewed AP, lateral,sunrise, and flexed PA weight bearing knee X-rays (5 views) of the bilateral knees reviewed by me today from 06/29/2023. These x-rays show severe valgus deformity with lateral joint space narrowing with bone-on-bone articulation, osteophyte formation, sclerosis, and subchondral cyst formation. Kellgren-Lawrence grade 4 in the right knee. The left knee shows medial joint space narrowing with bone-on-bone articulation and tricompartmental degenerative changes with osteophyte formation, sclerosis and subchondral cyst formation. Kellgren-Lawrence grade 4 in the left knee. No fractures or dislocations noted in either knee.   Assessment:  ICD-10-CM  1. Primary osteoarthritis of right knee M17.11  Bilateral knee osteoarthritis  Plan: Zari is a 73 year old female who presents for history and physical for right total knee arthroplasty with Dr. Audelia Acton on 08/16/2023. She has severe valgus deformity of the right knee with complete loss of joint space in the lateral compartment. She has pain swelling and instability that is interfering with her quality of life and activities daily living. She has had no relief with conservative treatment. Risks, benefits, complications of a right total knee arthroplasty have been discussed with the patient. Patient has agreed and consented procedure with Dr. Audelia Acton on 08/16/2023.  The hospitalization and post-operative care and rehabilitation were also discussed. The use of perioperative antibiotics and DVT prophylaxis were discussed. The risk, benefits and alternatives to a surgical intervention were  discussed  at length with the patient. The patient was also advised of risks related to the medical comorbidities and elevated body mass index (BMI). A lengthy discussion took place to review the most common complications including but not limited to: stiffness, loss of function, complex regional pain syndrome, deep vein thrombosis, pulmonary embolus, heart attack, stroke, infection, wound breakdown, numbness, intraoperative fracture, damage to nerves, tendon,muscles, arteries or other blood vessels, death and other possible complications from anesthesia. The patient was told that we will take steps to minimize these risks by using sterile technique, antibiotics and DVT prophylaxis when appropriate and follow the patient postoperatively in the office setting to monitor progress. The possibility of recurrent pain, no improvement in pain and actual worsening of pain were also discussed with the patient. We do specific on rotation about the risk of peroneal nerve injury due to the valgus deformity correction.  All questions answered patient agrees with above plan for right total knee arthroplasty.

## 2023-08-16 NOTE — Transfer of Care (Signed)
Immediate Anesthesia Transfer of Care Note  Patient: Dawn Lynch  Procedure(s) Performed: TOTAL KNEE ARTHROPLASTY (Right: Knee)  Patient Location: PACU  Anesthesia Type:General  Level of Consciousness: awake, alert , and oriented  Airway & Oxygen Therapy: Patient Spontanous Breathing  Post-op Assessment: Report given to RN and Post -op Vital signs reviewed and stable  Post vital signs: stable  Last Vitals:  Vitals Value Taken Time  BP 117/73 08/16/23 1000  Temp    Pulse 71 08/16/23 1002  Resp 18 08/16/23 1002  SpO2 98 % 08/16/23 1002  Vitals shown include unfiled device data.  Last Pain:  Vitals:   08/16/23 0622  TempSrc: Temporal  PainSc: 2          Complications: No notable events documented.

## 2023-08-16 NOTE — TOC Progression Note (Signed)
Transition of Care [facility]) - Progression Note    Patient Details  Name: Dawn Lynch MRN: [id number] Date of Birth: 19-Jan-1951  Transition of Care [facility]) CM/SW Contact  Marlowe Sax, RN Phone Number: 08/16/2023, 2:56 PM  Clinical Narrative:     The patient will have  a 3 in1 delivered to the bedside prior to dc, she has a rolling walker at home, [facility]  Is set up for [facility] prior to surgery by Surgeons office        Expected Discharge Plan and Services                                               Social Determinants of Health (SDOH) Interventions SDOH Screenings   Food Insecurity: No Food Insecurity (08/16/2023)  Housing: Low Risk  (08/16/2023)  Transportation Needs: No Transportation Needs (08/16/2023)  Utilities: Not At Risk (08/16/2023)  Financial Resource Strain: Low Risk  (06/21/2023)   Received from [facility]  Tobacco Use: Medium Risk (08/16/2023)    Readmission Risk Interventions     No data to display

## 2023-08-16 NOTE — Plan of Care (Signed)

## 2023-08-16 NOTE — Op Note (Signed)
Patient Name: Dawn Lynch  WUJ:[id number]  Pre-Operative Diagnosis: Right knee Osteoarthritis  Post-Operative Diagnosis: (same)  Procedure: Right Total Knee Arthroplasty  Components/Implants: Femur: Persona Size 9 Narrow PS   Tibia: Persona Size E w/ 14x84mm stem extension  Poly: 13mm PS  Patella: 29x80mm symmetric  Femoral Valgus Cut Angle: 5 degrees  Distal Femoral Re-cut: none  Patella Resurfacing: yes   Date of Surgery: 08/16/2023  Surgeon: Reinaldo Berber MD  Assistant: Amador Cunas PA (present and scrubbed throughout the case, critical for assistance with exposure, retraction, instrumentation, and closure)   Anesthesiologist: Piscitello  Anesthesia: Spinal   Tourniquet Time: 82 min  EBL: 50cc  IVF: 800cc  Complications: None   Brief history: The patient is a 73 year old female with a history of osteoarthritis of the right knee with pain limiting their range of motion and activities of daily living, which has failed multiple attempts at conservative therapy.  The risks and benefits of total knee arthroplasty as definitive surgical treatment were discussed with the patient, who opted to proceed with the operation.  After outpatient medical clearance and optimization was completed the patient was admitted to [facility] for the procedure.  All preoperative films were reviewed and an appropriate surgical plan was made prior to surgery. Preoperative range of motion was 0 to 115. The patient was identified as having a severe Valgus alignment which was partially correctable.   Description of procedure: The patient was brought to the operating room where laterality was confirmed by all those present to be the right side.   Spinal anesthesia was administered and the patient received an intravenous dose of antibiotics for surgical prophylaxis and a dose of tranexamic acid.  Patient is positioned supine on the operating room table with all bony prominences  well-padded.  A well-padded tourniquet was applied to the right thigh.  The knee was then prepped and draped in usual sterile fashion with multiple layers of adhesive and nonadhesive drapes.  All of those present in the operating room participated in a surgical timeout laterality and patient were confirmed.   An Esmarch was wrapped around the extremity and the leg was elevated and the knee flexed.  The tourniquet was inflated to a pressure of 250 mmHg. The Esmarch was removed and the leg was brought down to full extension.  The patella and tibial tubercle identified and outlined using a marking pen and a midline skin incision was made with a knife carried through the subcutaneous tissue down to the extensor retinaculum.  After exposure of the extensor mechanism the medial parapatellar arthrotomy was performed with a scalpel and electrocautery extending down medial and distal to the tibial tubercle taking care to avoid incising the patellar tendon.   A standard medial release was performed over the proximal tibia.  The knee was brought into extension in order to excise the fat pad taking care not to damage the patella tendon.  The superior soft tissue was removed from the anterior surface of the distal femur to visualize for the procedure.  The knee was then brought into flexion with the patella subluxed laterally and subluxing the tibia anteriorly.  The ACL was transected and removed with electrocautery and additional soft tissue was removed from the proximal surface of the tibia to fully expose. The remaining PCL tissue was also carefully removed.   An extramedullary tibial cutting guide was then applied to the leg with a spring-loaded ankle clamp placed around the distal tibia just above the malleoli the angulation of  the guide was adjusted to give some posterior slope in the tibial resection with an appropriate varus/valgus alignment.  The resection guide was then pinned to the proximal tibia and the  proximal tibial surface was resected with an oscillating saw.  Careful attention was paid to ensure the blade did not disrupt any of the soft tissues including any lateral or medial ligament.  Attention was then turned to the femur, with the knee slightly flexed a opening drill was used to enter the medullary canal of the femur.  After removing the drill marrow was suctioned out to decompress the distal femur.  An intramedullary femoral guide was then inserted into the drill hole and the alignment guide was seated firmly against the distal end of the medial femoral condyle.  The distal femoral cutting guide was then attached and pinned securely to the anterior surface of the femur and the intramedullary rod and alignment guide was removed.  Distal femur resection was then performed with an oscillating saw with retractors protecting medial and laterally.   The distal cutting block was then removed and the extension gap was checked with a spacer.  Extension gap was found to be appropriately sized to accommodate the spacer block.   The femoral sizing guide was then placed securely into the posterior condyles of the femur and the femoral size was measured and determined to be 9.  The size 9; 4-in-1 cutting guide was placed in position and secured with 2 pins.  The anterior posterior and chamfer resections were then performed with an oscillating saw.  Bony fragments and osteophytes were then removed.  Using a lamina spreader the posterior medial and lateral condyles were checked for additional osteophytes and posterior soft tissue remnants.  Any remaining meniscus was removed at this time.  Periarticular injection was performed in the meniscal rims and posterior capsule with aspiration performed to ensure no intravascular injection.   The tibia was then exposed and the tibial trial was pinned onto the plateau after confirming appropriate orientation and rotation.  Using the drill bushing the tibia was prepared to  the appropriate drill depth.  Tibial broach impactor was then driven through the punch guide using a mallet.  The femoral trial component was then inserted onto the femur. The femoral box was then cut with an oscillating saw.  A trial tibial polyethylene bearing was then placed and the knee was reduced.  The knee achieved full extension with no hyperextension and was found to be balanced in flexion and extension with the trials in place.  The knee was then brought into full extension the patella was everted and held with 2 Kocher clamps.  The articular surface of the patella was then resected with an patella reamer and saw after careful measurement with a caliper.  The patella was then prepared with the drill guide and a trial patella was placed.  The knee was then taken through range of motion and it was found that the patella articulated appropriately with the trochlea and good patellofemoral motion without subluxation.    The correct final components for implantation were confirmed and opened by the circulator nurse.  A bone plug was fashioned from the patient's bone cuts to plug the intramedullary femoral canal access hole and was tamped into place.  The prepared surfaces of the patella femur and tibia were cleaned with pulsatile lavage to remove all blood fat and other material and then the surfaces were dried.  2 bags of cement were mixed under vacuum and  the components were cemented into place.  Excess cement was removed with curettes and forceps. A trial polyethylene tibial component was placed and the knee was brought into extension to allow the cement to set.  At this time the periarticular injection cocktail was placed in the soft tissues surrounding the knee.  After full curing of the cement the balance of the knee was checked again and the final polyethylene size was confirmed. The tibial component was irrigated and locking mechanism checked to ensure it was clear of debris. The real polyethylene  tibial component was implanted and the knee was brought through a range of motion.   The knee was then irrigated with copious amount of normal saline via pulsatile lavage to remove all loose bodies and other debris.  The knee was then irrigated with surgiphor betadine based wash and reirrigated with saline.  The tourniquet was then dropped and all bleeding vessels were identified and coagulated.  The arthrotomy was approximated with #1 Vicryl and closed with #1 Stratafix suture.  The knee was brought into slight flexion and the subcutaneous tissues were closed with 0 Vicryl, 2-0 Vicryl and a running subcuticular 4-0 stratafix barbed suture.  Skin was then glued with Dermabond.  A sterile adhesive dressing was then placed along with a sequential compression device to the calf, a Ted stocking, and a cryotherapy cuff.   Sponge, needle, and Lap counts were all correct at the end of the case.   The patient was transferred off of the operating room table to a hospital bed, good pulses were found distally on the operative side.  The patient was transferred to the recovery room in stable condition.

## 2023-08-16 NOTE — Anesthesia Preprocedure Evaluation (Signed)
Anesthesia Evaluation  Patient identified by MRN, date of birth, ID band Patient awake    Reviewed: Allergy & Precautions, NPO status , Patient's Chart, lab work & pertinent test results  History of Anesthesia Complications (+) PONV and history of anesthetic complications  Airway Mallampati: III  TM Distance: <3 FB Neck ROM: full    Dental  (+) Chipped   Pulmonary neg shortness of breath, asthma , former smoker   Pulmonary exam normal        Cardiovascular Exercise Tolerance: Good hypertension, (-) angina (-) Past MI Normal cardiovascular exam     Neuro/Psych negative neurological ROS  negative psych ROS   GI/Hepatic negative GI ROS, Neg liver ROS,neg GERD  ,,  Endo/Other  Hypothyroidism    Renal/GU      Musculoskeletal   Abdominal   Peds  Hematology negative hematology ROS (+)   Anesthesia Other Findings Past Medical History: No date: Ankle swelling No date: Arthritis     Comment:  lower back, shoulders No date: Asthma No date: Dyspnea No date: History of kidney stones No date: Hypertension No date: Hypothyroidism No date: PONV (postoperative nausea and vomiting)     Comment:  after hip surgery  Past Surgical History: No date: CARPAL TUNNEL RELEASE; Right 05/10/2019: CATARACT EXTRACTION W/PHACO; Right     Comment:  Procedure: CATARACT EXTRACTION PHACO AND INTRAOCULAR               LENS PLACEMENT (IOC) RIGHT  00:42.7  18.4%  7.89;                Surgeon: Lockie Mola, MD;  Location: [facility];  Service: Ophthalmology;  Laterality:               Right; 05/31/2019: CATARACT EXTRACTION W/PHACO; Left     Comment:  Procedure: CATARACT EXTRACTION PHACO AND INTRAOCULAR               LENS PLACEMENT (IOC) LEFT 3.65  00:36.9  9.9%;  Surgeon:               Lockie Mola, MD;  Location[facility];              Service: Ophthalmology;  Laterality: Left; No date:  CHOLECYSTECTOMY No date: EYE SURGERY No date: FOOT SURGERY No date: HERNIA REPAIR No date: JOINT REPLACEMENT; Bilateral     Comment:  hips replaced No date: KIDNEY STONE SURGERY     Comment:  x 3 No date: TOTAL HIP ARTHROPLASTY; Bilateral  BMI    Body Mass Index: 39.06 kg/m      Reproductive/Obstetrics negative OB ROS                             Anesthesia Physical Anesthesia Plan  ASA: 3  Anesthesia Plan: Spinal   Post-op Pain Management:    Induction:   PONV Risk Score and Plan:   Airway Management Planned: Natural Airway and Nasal Cannula  Additional Equipment:   Intra-op Plan:   Post-operative Plan:   Informed Consent: I have reviewed the patients History and Physical, chart, labs and discussed the procedure including the risks, benefits and alternatives for the proposed anesthesia with the patient or authorized representative who has indicated his/her understanding and acceptance.     Dental Advisory Given  Plan Discussed with: Anesthesiologist, CRNA and Surgeon  Anesthesia Plan  Comments: (Patient reports no bleeding problems and no anticoagulant use.  Plan for spinal with backup GA  Patient consented for risks of anesthesia including but not limited to:  - adverse reactions to medications - damage to eyes, teeth, lips or other oral mucosa - nerve damage due to positioning  - risk of bleeding, infection and or nerve damage from spinal that could lead to paralysis - risk of headache or failed spinal - damage to teeth, lips or other oral mucosa - sore throat or hoarseness - damage to heart, brain, nerves, lungs, other parts of body or loss of life  Patient voiced understanding and assent.)       Anesthesia Quick Evaluation

## 2023-08-17 DIAGNOSIS — M1711 Unilateral primary osteoarthritis, right knee: Secondary | ICD-10-CM | POA: Diagnosis not present

## 2023-08-17 LAB — BASIC METABOLIC PANEL
Anion gap: 9 (ref 5–15)
BUN: 33 mg/dL — ABNORMAL HIGH (ref 8–23)
CO2: 23 mmol/L (ref 22–32)
Calcium: 8.4 mg/dL — ABNORMAL LOW (ref 8.9–10.3)
Chloride: 102 mmol/L (ref 98–111)
Creatinine, Ser: 1 mg/dL (ref 0.44–1.00)
GFR, Estimated: >60 mL/min (ref >60)
Glucose, Bld: 136 mg/dL — ABNORMAL HIGH (ref 70–99)
Potassium: 4.2 mmol/L (ref 3.5–5.1)
Sodium: 134 mmol/L — ABNORMAL LOW (ref 135–145)

## 2023-08-17 LAB — CBC
HCT: 31.4 % — ABNORMAL LOW (ref 36.0–46.0)
Hemoglobin: 10.4 g/dL — ABNORMAL LOW (ref 12.0–15.0)
MCH: 31.5 pg (ref 26.0–34.0)
MCHC: 33.1 g/dL (ref 30.0–36.0)
MCV: 95.2 fL (ref 80.0–100.0)
Platelets: 319 10*3/uL (ref 150–400)
RBC: 3.3 MIL/uL — ABNORMAL LOW (ref 3.87–5.11)
RDW: 12.5 % (ref 11.5–15.5)
WBC: 13.1 10*3/uL — ABNORMAL HIGH (ref 4.0–10.5)
nRBC: 0 % (ref 0.0–0.2)

## 2023-08-17 MED ORDER — ONDANSETRON HCL 4 MG PO TABS: 4.0000 mg | ORAL_TABLET | Freq: Four times a day (QID) | ORAL | 0 refills | Status: DC | PRN

## 2023-08-17 MED ORDER — DOCUSATE SODIUM 100 MG PO CAPS: 100.0000 mg | ORAL_CAPSULE | Freq: Two times a day (BID) | ORAL | 0 refills | Status: DC

## 2023-08-17 MED ORDER — ENOXAPARIN SODIUM 30 MG/0.3ML IJ SOSY
PREFILLED_SYRINGE | INTRAMUSCULAR | Status: AC
  Filled 2023-08-17: qty 0.3

## 2023-08-17 MED ORDER — ENOXAPARIN SODIUM 40 MG/0.4ML IJ SOSY: 40.0000 mg | PREFILLED_SYRINGE | INTRAMUSCULAR | 0 refills | Status: AC

## 2023-08-17 MED ORDER — TRAMADOL HCL 50 MG PO TABS: 50.0000 mg | ORAL_TABLET | Freq: Four times a day (QID) | ORAL | 0 refills | Status: DC | PRN

## 2023-08-17 MED ORDER — OXYCODONE HCL 5 MG PO TABS: 2.5000 mg | ORAL_TABLET | Freq: Three times a day (TID) | ORAL | 0 refills | Status: DC | PRN

## 2023-08-17 MED ORDER — CELECOXIB 100 MG PO CAPS: 100.0000 mg | ORAL_CAPSULE | Freq: Two times a day (BID) | ORAL | 0 refills | Status: AC

## 2023-08-17 MED ORDER — IRBESARTAN 150 MG PO TABS
150.0000 mg | ORAL_TABLET | Freq: Every day | ORAL | Status: DC
Start: 2023-08-18 — End: 2023-08-17

## 2023-08-17 MED ORDER — ACETAMINOPHEN 500 MG PO TABS: 1000.0000 mg | ORAL_TABLET | Freq: Three times a day (TID) | ORAL | 0 refills | Status: DC

## 2023-08-17 NOTE — Anesthesia Postprocedure Evaluation (Signed)
 Anesthesia Post Note  Patient: Dawn Lynch  Procedure(s) Performed: TOTAL KNEE ARTHROPLASTY (Right: Knee)  Patient location during evaluation: Nursing Unit Anesthesia Type: Spinal Level of consciousness: awake Pain management: pain level controlled Respiratory status: spontaneous breathing Cardiovascular status: stable Postop Assessment: no headache Anesthetic complications: no   There were no known notable events for this encounter.   Last Vitals:  Vitals:   08/16/23 2127 08/17/23 0008  BP: (!) 141/76 128/63  Pulse: 80 75  Resp: 16 16  Temp: 36.9 C   SpO2: 100% 97%    Last Pain:  Vitals:   08/17/23 0513  TempSrc:   PainSc: Asleep                 Shona Earnie Fare

## 2023-08-17 NOTE — Plan of Care (Signed)
  Problem: Activity: Goal: Risk for activity intolerance will decrease Outcome: Progressing   Problem: Coping: Goal: Level of anxiety will decrease Outcome: Progressing   Problem: Elimination: Goal: Will not experience complications related to urinary retention Outcome: Progressing   Problem: Pain Management: Goal: Pain level will decrease with appropriate interventions Outcome: Progressing

## 2023-08-17 NOTE — Progress Notes (Signed)
   Subjective: 1 Day Post-Op Procedure(s) (LRB): TOTAL KNEE ARTHROPLASTY (Right) Patient reports pain as mild.   Patient is well, and has had no acute complaints or problems Denies any CP, SOB, ABD pain. We will continue therapy today.  Plan is to go Home after hospital stay.  Objective: Vital signs in last 24 hours: Temp:  [97.7 F (36.5 C)-98.5 F (36.9 C)] 98.5 F (36.9 C) (02/04 0749) Pulse Rate:  [65-94] 94 (02/04 0749) Resp:  [11-17] 17 (02/04 0749) BP: (103-160)/(61-86) 103/65 (02/04 0749) SpO2:  [94 %-100 %] 99 % (02/04 0749)  Intake/Output from previous day: 02/03 0701 - 02/04 0700 In: 1107.5 [I.V.:807.5; IV Piggyback:300] Out: 50 [Blood:50] Intake/Output this shift: No intake/output data recorded.  Recent Labs    08/17/23 0648  HGB 10.4*   Recent Labs    08/17/23 0648  WBC 13.1*  RBC 3.30*  HCT 31.4*  PLT 319   Recent Labs    08/17/23 0648  NA 134*  K 4.2  CL 102  CO2 23  BUN 33*  CREATININE 1.00  GLUCOSE 136*  CALCIUM 8.4*   No results for input(s): "LABPT", "INR" in the last 72 hours.  EXAM General - Patient is Alert, Appropriate, and Oriented Extremity - Neurovascular intact Sensation intact distally Intact pulses distally Dorsiflexion/Plantar flexion intact Dressing - dressing C/D/I and no drainage Motor Function - intact, moving foot and toes well on exam.   Past Medical History:  Diagnosis Date   Ankle swelling    Arthritis    lower back, shoulders   Asthma    Dyspnea    History of kidney stones    Hypertension    Hypothyroidism    PONV (postoperative nausea and vomiting)    after hip surgery    Assessment/Plan:   1 Day Post-Op Procedure(s) (LRB): TOTAL KNEE ARTHROPLASTY (Right) Principal Problem:   S/P total knee arthroplasty, right  Estimated body mass index is 39.06 kg/m as calculated from the following:   Height as of this encounter: 5' 6.5" (1.689 m).   Weight as of this encounter: 111.4 kg. Advance diet Up  with therapy Pain well controlled VSS, BP soft, hold ARB HTN medication  Labs stable CM to assist with discharge to home with HHPT today  DVT Prophylaxis - Lovenox, TED hose, and SCDs Weight-Bearing as tolerated to right leg   T. Cranston Neighbor, PA-C [facility] 08/17/2023, 8:15 AM

## 2023-08-17 NOTE — Plan of Care (Signed)
  Problem: Activity: Goal: Risk for activity intolerance will decrease Outcome: Progressing   Problem: Elimination: Goal: Will not experience complications related to urinary retention Outcome: Progressing   Problem: Pain Managment: Goal: General experience of comfort will improve and/or be controlled Outcome: Progressing

## 2023-08-17 NOTE — Progress Notes (Signed)
DISCHARGE NOTE:  Pt given discharge instructions and verbalized understanding. TED hose on both legs. [facility] sent with pt. Pt wheeled to car by staff, friend providing transportation home.

## 2023-08-17 NOTE — Progress Notes (Signed)
 Physical Therapy Treatment Patient Details Name: Dawn Lynch MRN: [id number] DOB: [birth year] Today's Date: 08/17/2023   History of Present Illness Patient is a 73 year old female with right knee osteoarthritis s/p right total knee arthroplasty. History of bilateral hip replacements.    PT Comments  Mobility is adequate for discharge home. Gait training progressed in hallway today using rolling walker. Stair training completed. The patient is eager to be discharged home with family support.    If plan is discharge home, recommend the following: Assist for transportation;Help with stairs or ramp for entrance   Can travel by private vehicle        Equipment Recommendations  BSC/3in1    Recommendations for Other Services       Precautions / Restrictions Precautions Precautions: Knee;Fall Precaution Booklet Issued: Yes (comment) Restrictions Weight Bearing Restrictions Per Provider Order: Yes RLE Weight Bearing Per Provider Order: Weight bearing as tolerated     Mobility  Bed Mobility Overal bed mobility: Modified Independent                  Transfers Overall transfer level: Needs assistance Equipment used: Rolling walker (2 wheels) Transfers: Sit to/from Stand Sit to Stand: Supervision           General transfer comment: several standing bouts performed from various surfaces including bed, bed side commode over toilet, and recliner chair. good safety awareness with initial cues for hand placement    Ambulation/Gait Ambulation/Gait assistance: Supervision Gait Distance (Feet): 150 Feet Assistive device: Rolling walker (2 wheels) Gait Pattern/deviations: Step-through pattern, Step-to pattern, Decreased stance time - right, Antalgic       General Gait Details: no loss of balance with hallway ambluation using rolling walker. good safety awareness and demonstration of correct sequencing   Stairs Stairs: Yes Stairs assistance: Contact guard  assist Stair Management: Two rails, Step to pattern, Forwards Number of Stairs: 4 General stair comments: patient went up/down 4 steps with rails. initial cues for sequencing with carry over demonstrated   Wheelchair Mobility     Tilt Bed    Modified Rankin (Stroke Patients Only)       Balance Overall balance assessment: Needs assistance Sitting-balance support: Feet supported Sitting balance-Leahy Scale: Fair     Standing balance support: Bilateral upper extremity supported Standing balance-Leahy Scale: Fair Standing balance comment: rolling walker for safety with standing                            Cognition Arousal: Alert Behavior During Therapy: WFL for tasks assessed/performed Overall Cognitive Status: Within Functional Limits for tasks assessed                                          Exercises      General Comments        Pertinent Vitals/Pain Pain Assessment Pain Assessment: 0-10 Pain Score: 8  Pain Location: R knee Pain Descriptors / Indicators: Discomfort Pain Intervention(s): Limited activity within patient's tolerance, Monitored during session, Repositioned (encouraged polar care)    Home Living                          Prior Function            PT Goals (current goals can now be found in the care plan section) Acute  Rehab PT Goals Patient Stated Goal: to go home PT Goal Formulation: With patient Time For Goal Achievement: 08/30/23 Potential to Achieve Goals: Good Progress towards PT goals: Progressing toward goals    Frequency    BID      PT Plan      Co-evaluation              AM-PAC PT 6 Clicks Mobility   Outcome Measure  Help needed turning from your back to your side while in a flat bed without using bedrails?: A Little Help needed moving from lying on your back to sitting on the side of a flat bed without using bedrails?: A Little Help needed moving to and from a bed to a  chair (including a wheelchair)?: A Little Help needed standing up from a chair using your arms (e.g., wheelchair or bedside chair)?: A Little Help needed to walk in hospital room?: A Little Help needed climbing 3-5 steps with a railing? : A Little 6 Click Score: 18    End of Session Equipment Utilized During Treatment: Gait belt Activity Tolerance: Patient tolerated treatment well Patient left: in chair;with call bell/phone within reach;with nursing/sitter in room Nurse Communication: Mobility status PT Visit Diagnosis: Difficulty in walking, not elsewhere classified (R26.2);Other abnormalities of gait and mobility (R26.89)     Time: 9084-9057 PT Time Calculation (min) (ACUTE ONLY): 27 min  Charges:    $Gait Training: 23-37 mins PT General Charges $$ ACUTE PT VISIT: 1 Visit                     Dawn Lynch, PT, MPT    Dawn Lynch 08/17/2023, 11:18 AM

## 2023-08-17 NOTE — Discharge Summary (Signed)
 Physician Discharge Summary  Patient ID: Dawn Lynch MRN: [id number] DOB/AGE: [birth year] 73 y.o.  Admit date: 08/16/2023 Discharge date: 08/17/2023  Admission Diagnoses:  S/P total knee arthroplasty, right [Z96.651]   Discharge Diagnoses: Patient Active Problem List   Diagnosis Date Noted   S/P total knee arthroplasty, right 08/16/2023   CELLULITIS, FOOT, RIGHT 12/09/2008    Past Medical History:  Diagnosis Date   Ankle swelling    Arthritis    lower back, shoulders   Asthma    Dyspnea    History of kidney stones    Hypertension    Hypothyroidism    PONV (postoperative nausea and vomiting)    after hip surgery     Transfusion: none   Consultants (if any):   Discharged Condition: Improved  Hospital Course: AVYA FLAVELL is an 73 y.o. female who was admitted 08/16/2023 with a diagnosis of S/P total knee arthroplasty, right and went to the operating room on 08/16/2023 and underwent the above named procedures.    Surgeries: Procedure(s): TOTAL KNEE ARTHROPLASTY on 08/16/2023 Patient tolerated the surgery well. Taken to PACU where she was stabilized and then transferred to the orthopedic floor.  Started on Lovenox  30 mg q 12 hrs. TEDs and SCDs applied bilaterally. Heels elevated on bed. No evidence of DVT. Negative Homan. Physical therapy started on day #1 for gait training and transfer. OT started day #1 for ADL and assisted devices.  Patient's IV was d/c on day #1. Patient was able to safely and independently complete all PT goals. PT recommending discharge to home.    On post op day #1 patient was stable and ready for discharge to home with HHPT.  Implants: Femur: Persona Size 9 Narrow PS   Tibia: Persona Size E w/ 14x68mm stem extension  Poly: 13mm PS  Patella: 29x21mm symmetric   She was given perioperative antibiotics:  Anti-infectives (From admission, onward)    Start     Dose/Rate Route Frequency Ordered Stop   08/16/23 1400  ceFAZolin  (ANCEF ) IVPB  2g/100 mL premix        2 g 200 mL/hr over 30 Minutes Intravenous Every 6 hours 08/16/23 1224 08/16/23 2155   08/16/23 0600  ceFAZolin  (ANCEF ) IVPB 3g/150 mL premix        3 g 300 mL/hr over 30 Minutes Intravenous On call to O.R. 08/15/23 2144 08/16/23 0745     .  She was given sequential compression devices, early ambulation, and Lovenox  TEDs for DVT prophylaxis.  She benefited maximally from the hospital stay and there were no complications.    Recent vital signs:  Vitals:   08/17/23 0008 08/17/23 0749  BP: 128/63 103/65  Pulse: 75 94  Resp: 16 17  Temp:  98.5 F (36.9 C)  SpO2: 97% 99%    Recent laboratory studies:  Lab Results  Component Value Date   HGB 10.4 (L) 08/17/2023   HGB 11.8 (L) 08/03/2023   Lab Results  Component Value Date   WBC 13.1 (H) 08/17/2023   PLT 319 08/17/2023   No results found for: INR Lab Results  Component Value Date   NA 134 (L) 08/17/2023   K 4.2 08/17/2023   CL 102 08/17/2023   CO2 23 08/17/2023   BUN 33 (H) 08/17/2023   CREATININE 1.00 08/17/2023   GLUCOSE 136 (H) 08/17/2023    Discharge Medications:   Allergies as of 08/17/2023   No Known Allergies      Medication List     STOP  taking these medications    meloxicam 7.5 MG tablet Commonly known as: MOBIC   Rybelsus 14 MG Tabs Generic drug: Semaglutide       TAKE these medications    acetaminophen  500 MG tablet Commonly known as: TYLENOL  Take 2 tablets (1,000 mg total) by mouth every 8 (eight) hours.   celecoxib  100 MG capsule Commonly known as: CeleBREX  Take 1 capsule (100 mg total) by mouth 2 (two) times daily for 7 days.   chlorthalidone  25 MG tablet Commonly known as: HYGROTON  Take 25 mg by mouth daily.   docusate sodium  100 MG capsule Commonly known as: COLACE Take 1 capsule (100 mg total) by mouth 2 (two) times daily.   enoxaparin  40 MG/0.4ML injection Commonly known as: LOVENOX  Inject 0.4 mLs (40 mg total) into the skin daily for 14 days.    gabapentin  600 MG tablet Commonly known as: NEURONTIN  Take 600 mg by mouth daily as needed.   levothyroxine  150 MCG tablet Commonly known as: SYNTHROID  Take 150 mcg by mouth daily before breakfast.   olmesartan 40 MG tablet Commonly known as: BENICAR Take 1 tablet by mouth daily.   ondansetron  4 MG tablet Commonly known as: ZOFRAN  Take 1 tablet (4 mg total) by mouth every 6 (six) hours as needed for nausea.   oxyCODONE  5 MG immediate release tablet Commonly known as: Roxicodone  Take 0.5-1 tablets (2.5-5 mg total) by mouth every 8 (eight) hours as needed for breakthrough pain.   SUPER B COMPLEX PO Take by mouth.   traMADol  50 MG tablet Commonly known as: ULTRAM  Take 1 tablet (50 mg total) by mouth every 6 (six) hours as needed for moderate pain (pain score 4-6).   WOMENS DAILY FORMULA PO Take by mouth.               Durable Medical Equipment  (From admission, onward)           Start     Ordered   08/16/23 1552  For home use only DME Bedside commode  Once       Question:  Patient needs a bedside commode to treat with the following condition  Answer:  Impaired mobility   08/16/23 1551            Diagnostic Studies: DG Knee Right Port Result Date: 08/16/2023 CLINICAL DATA:  Status post right knee arthroplasty. EXAM: PORTABLE RIGHT KNEE - 1-2 VIEW COMPARISON:  None Available. FINDINGS: Right knee arthroplasty in expected alignment. No periprosthetic lucency or fracture. There has been patellar resurfacing. Recent postsurgical change includes air and edema in the soft tissues and joint space. Small osseous projection arising from the distal femur posteriorly on the lateral view may represent an osteochondroma. IMPRESSION: Right knee arthroplasty without immediate postoperative complication. Electronically Signed   By: Andrea Gasman M.D.   On: 08/16/2023 10:27    Disposition:      Follow-up Information     Charlene Debby BROCKS, PA-C Follow up in 2 week(s).    Specialties: Orthopedic Surgery, Emergency Medicine Contact information: [address] [place] [address] [phone]                  Signed: Debby BROCKS Charlene 08/17/2023, 8:19 AM

## 2023-08-17 NOTE — Discharge Instructions (Signed)
 Instructions after Total Knee Replacement   Dawn Lynch M.D.     Dept. of Orthopaedics & Sports Medicine  [facility]  [address]  [place], [place]  [address]  Phone: [phone]   Fax: [phone]    DIET: Drink plenty of non-alcoholic fluids. Resume your normal diet. Include foods high in fiber.  ACTIVITY:  You may use crutches or a walker with weight-bearing as tolerated, unless instructed otherwise. You may be weaned off of the walker or crutches by your Physical Therapist.  Do NOT place pillows under the knee. Anything placed under the knee could limit your ability to straighten the knee.   Continue doing gentle exercises. Exercising will reduce the pain and swelling, increase motion, and prevent muscle weakness.   Please continue to use the TED compression stockings for 2 weeks. You may remove the stockings at night, but should reapply them in the morning. Do not drive or operate any equipment until instructed.  WOUND CARE:  Continue to use the PolarCare or ice packs periodically to reduce pain and swelling. You may begin showering 3 days after surgery with honeycomb dressing. Remove honeycomb dressing 7 days after surgery and continue showering. Allow dermabond to fall off on its own.  MEDICATIONS: You may resume your regular medications. Please take the pain medication as prescribed on the medication. Do not take pain medication on an empty stomach. You have been given a prescription for a blood thinner (Lovenox or Coumadin). Please take the medication as instructed. (NOTE: After completing a 2 week course of Lovenox, take one 81 mg Enteric-coated aspirin twice a day for 3 additional weeks. This along with elevation will help reduce the possibility of phlebitis in your operated leg.) Do not drive or drink alcoholic beverages when taking pain medications.  POSTOPERATIVE CONSTIPATION PROTOCOL Constipation - defined medically as fewer than three stools per  week and severe constipation as less than one stool per week.  One of the most common issues patients have following surgery is constipation.  Even if you have a regular bowel pattern at home, your normal regimen is likely to be disrupted due to multiple reasons following surgery.  Combination of anesthesia, postoperative narcotics, change in appetite and fluid intake all can affect your bowels.  In order to avoid complications following surgery, here are some recommendations in order to help you during your recovery period.  Colace (docusate) - Pick up an over-the-counter form of Colace or another stool softener and take twice a day as long as you are requiring postoperative pain medications.  Take with a full glass of water daily.  If you experience loose stools or diarrhea, hold the colace until you stool forms back up.  If your symptoms do not get better within 1 week or if they get worse, check with your doctor.  Dulcolax (bisacodyl) - Pick up over-the-counter and take as directed by the product packaging as needed to assist with the movement of your bowels.  Take with a full glass of water.  Use this product as needed if not relieved by Colace only.   MiraLax (polyethylene glycol) - Pick up over-the-counter to have on hand.  MiraLax is a solution that will increase the amount of water in your bowels to assist with bowel movements.  Take as directed and can mix with a glass of water, juice, soda, coffee, or tea.  Take if you go more than two days without a movement. Do not use MiraLax more than once per day.  Call your doctor if you are still constipated or irregular after using this medication for 7 days in a row.  If you continue to have problems with postoperative constipation, please contact the office for further assistance and recommendations.  If you experience "the worst abdominal pain ever" or develop nausea or vomiting, please contact the office immediatly for further recommendations for  treatment.   CALL THE OFFICE FOR: Temperature above 101 degrees Excessive bleeding or drainage on the dressing. Excessive swelling, coldness, or paleness of the toes. Persistent nausea and vomiting.  FOLLOW-UP:  You should have an appointment to return to the office in 14 days after surgery. Arrangements have been made for continuation of Physical Therapy (either home therapy or outpatient therapy).

## 2023-08-18 ENCOUNTER — Encounter: Payer: Self-pay | Admitting: Orthopedic Surgery

## 2023-11-21 ENCOUNTER — Emergency Department
Admission: EM | Admit: 2023-11-21 | Discharge: 2023-12-12 | Disposition: E | Attending: Emergency Medicine | Admitting: Emergency Medicine

## 2023-11-21 DIAGNOSIS — I469 Cardiac arrest, cause unspecified: Secondary | ICD-10-CM | POA: Insufficient documentation

## 2023-11-21 DIAGNOSIS — R079 Chest pain, unspecified: Secondary | ICD-10-CM | POA: Diagnosis present

## 2023-11-21 LAB — CBC
HCT: 39.9 % (ref 36.0–46.0)
Hemoglobin: 12.4 g/dL (ref 12.0–15.0)
MCH: 31.4 pg (ref 26.0–34.0)
MCHC: 31.1 g/dL (ref 30.0–36.0)
MCV: 101 fL — ABNORMAL HIGH (ref 80.0–100.0)
Platelets: 256 10*3/uL (ref 150–400)
RBC: 3.95 MIL/uL (ref 3.87–5.11)
RDW: 12.9 % (ref 11.5–15.5)
WBC: 13 10*3/uL — ABNORMAL HIGH (ref 4.0–10.5)
nRBC: 0 % (ref 0.0–0.2)

## 2023-11-21 LAB — COMPREHENSIVE METABOLIC PANEL WITH GFR
ALT: 82 U/L — ABNORMAL HIGH (ref 0–44)
AST: 57 U/L — ABNORMAL HIGH (ref 15–41)
Albumin: 3.2 g/dL — ABNORMAL LOW (ref 3.5–5.0)
Alkaline Phosphatase: 75 U/L (ref 38–126)
Anion gap: 10 (ref 5–15)
BUN: 32 mg/dL — ABNORMAL HIGH (ref 8–23)
CO2: 22 mmol/L (ref 22–32)
Calcium: 8.7 mg/dL — ABNORMAL LOW (ref 8.9–10.3)
Chloride: 107 mmol/L (ref 98–111)
Creatinine, Ser: 1.27 mg/dL — ABNORMAL HIGH (ref 0.44–1.00)
GFR, Estimated: 45 mL/min — ABNORMAL LOW (ref >60)
Glucose, Bld: 289 mg/dL — ABNORMAL HIGH (ref 70–99)
Potassium: 3.9 mmol/L (ref 3.5–5.1)
Sodium: 139 mmol/L (ref 135–145)
Total Bilirubin: 0.6 mg/dL (ref 0.0–1.2)
Total Protein: 5.6 g/dL — ABNORMAL LOW (ref 6.5–8.1)

## 2023-11-21 LAB — CBG MONITORING, ED: Glucose-Capillary: 188 mg/dL — ABNORMAL HIGH (ref 70–99)

## 2023-11-21 LAB — PROTIME-INR
INR: 1.1 (ref 0.8–1.2)
Prothrombin Time: 14.5 s (ref 11.4–15.2)

## 2023-11-21 LAB — LACTIC ACID, PLASMA: Lactic Acid, Venous: 5.9 mmol/L (ref 0.5–1.9)

## 2023-11-21 LAB — APTT: aPTT: 22 s — ABNORMAL LOW (ref 24–36)

## 2023-11-21 LAB — TROPONIN I (HIGH SENSITIVITY): Troponin I (High Sensitivity): 567 ng/L (ref <18)

## 2023-11-21 MED ORDER — EPINEPHRINE 1 MG/10ML IJ SOSY
PREFILLED_SYRINGE | INTRAMUSCULAR | Status: AC | PRN
  Administered 2023-11-21 (×4): 1 mg via INTRAVENOUS

## 2023-11-21 MED ORDER — CALCIUM CHLORIDE 10 % IV SOLN
INTRAVENOUS | Status: AC | PRN
  Administered 2023-11-21: 1 g via INTRAVENOUS

## 2023-11-21 MED ORDER — ROCURONIUM BROMIDE 10 MG/ML (PF) SYRINGE
PREFILLED_SYRINGE | INTRAVENOUS | Status: AC | PRN
  Administered 2023-11-21: 80 mg via INTRAVENOUS

## 2023-11-21 MED ORDER — EPINEPHRINE 1 MG/10ML IJ SOSY
PREFILLED_SYRINGE | INTRAMUSCULAR | Status: AC | PRN
  Administered 2023-11-21 (×2): 1 mg via INTRAVENOUS

## 2023-11-21 MED ORDER — TENECTEPLASE 50 MG IV KIT
PACK | INTRAVENOUS | Status: AC
  Filled 2023-11-21: qty 10

## 2023-11-21 MED ORDER — SODIUM BICARBONATE 8.4 % IV SOLN
INTRAVENOUS | Status: AC | PRN
  Administered 2023-11-21: 50 meq via INTRAVENOUS

## 2023-11-21 MED ORDER — EPINEPHRINE 1 MG/10ML IJ SOSY
PREFILLED_SYRINGE | INTRAMUSCULAR | Status: AC | PRN
  Administered 2023-11-21 (×8): 1 mg via INTRAVENOUS

## 2023-11-21 MED ORDER — ROCURONIUM BROMIDE 10 MG/ML (PF) SYRINGE
PREFILLED_SYRINGE | INTRAVENOUS | Status: AC
  Filled 2023-11-21: qty 10

## 2023-12-12 NOTE — Code Documentation (Signed)
 TNK 50mg  given by ED Charge Alisia Irons.

## 2023-12-12 NOTE — ED Triage Notes (Signed)
 Pt to ED via [facility] from home c/o witnessed syncopal episode.  Pt c/o SHOB, Chest pain; Emesis that started today. On EMS arrival pt hypotensive.   1500 fluid bolus given by EMS.  last VS: 67/40/ 96%15L, CBG 146.

## 2023-12-12 NOTE — Code Documentation (Signed)
Patient time of death occurred at 17:01. °

## 2023-12-12 NOTE — Code Documentation (Signed)
 1619: pulse check, no pulse, CPR resumed.  1621: no pulse, CPR resumed 1623: No pulse, CPR resumed. 16:26: no pulse, CPR resumed. 1628: no pulse, CPR resumed 1630: No pulse, CPR resumed 1632: No pulse, CPR resumed 1634: No pulse, CPR resumed 1636: no pulse, CPR resumed 1639: No pulse , CPR resumed 1641: No pulse CPR resumed 1643: No pulse, CPR resumed 1645: No pulse, CPR resumed 1648: No pulse, CPR resumed,  1651: No pulse, CPR resumed 1654: No pulse, CPR resumed 1656: No pulse, CPR resumed 1658: No pulse, CPR resumed  1701: Time of Death called.

## 2023-12-12 NOTE — Significant Event (Signed)
 Responded to overhead call of code blue. On arrival to room, Effective CPR being performed airway being managed while continuing CPR. Per staff member able to palpate femoral pulse during CPR. MD present, RN staff present. Pt had been placed on monitor/defib. Medications being given per orders.

## 2023-12-12 NOTE — Code Documentation (Signed)
 [facility] AC to notify [employer] [facility].

## 2023-12-12 NOTE — Progress Notes (Signed)
   11/19/2023 1700  Spiritual Encounters  Type of Visit Initial  Care provided to: Pt and family  Conversation partners present during encounter Nurse;Physician  Referral source Code page  Reason for visit Code  OnCall Visit Yes  Spiritual Framework  Presenting Themes Significant life change  Community/Connection Family;Friend(s)  Patient Stress Factors Major life changes  Family Stress Factors Major life changes  Interventions  Spiritual Care Interventions Made Compassionate presence;Reflective listening;Normalization of emotions;Bereavement/grief support;Supported grief process  Intervention Outcomes  Outcomes Patient family open to resources  Spiritual Care Plan  Spiritual Care Issues Still Outstanding No further spiritual care needs at this time (see row info)   Chaplain responded to code blue page. Chaplain met with clinical team and present calling family with patient status. Chaplain brought family to waiting room and present when doctor dicussed patient's status with family. Escorted family to patient room and present when time of death called. Chaplain provided compassionate presence and grief support to husband, son, and 2 close family friends. Chaplain provided reflective listening and support to family and friends during high emotional distress. No further spiritual care needs at this time. Chaplain is available for follow up as needed.

## 2023-12-12 NOTE — ED Notes (Signed)
 Pt continues to state "I can't breathe", very restless, pt then became unresponsive.

## 2023-12-12 NOTE — Progress Notes (Signed)
 Chaplain assisted in completing funeral paperwork with care team. Chaplain provided support for more family friends arriving. Chaplain is available for follow up as needed.

## 2023-12-12 NOTE — ED Provider Notes (Addendum)
 [facility] Provider Note    Event Date/Time   First MD Initiated Contact with Patient 12/07/2023 1613     (approximate)   History   Loss of Consciousness and Shortness of Breath   HPI Dawn Lynch is a 73 y.o. female presenting today for loss of consciousness.  Patient was reportedly at home earlier today when she had some chest pain and shortness of breath.  Had a witnessed syncopal episode with husband.  Reportedly unresponsive for 10 minutes and EMS was called.  Also had an episode of vomiting at that time.  With EMS she was reportedly bradycardic and hypotensive.  She was hypoxic on 6 L and placed on nonrebreather.  On arrival, patient is staying she cannot breathe.  Currently denying chest pain.  Denies any active abdominal pain, nausea, vomiting.  Keeps saying she has to use the bathroom.  Of note, she had surgery per chart review approximately 3 months ago.  She said she had a funny sensation in her legs but denied any specific leg pain or leg swelling recently.     Physical Exam   Triage Vital Signs: ED Triage Vitals  Encounter Vitals Group     BP      Systolic BP Percentile      Diastolic BP Percentile      Pulse      Resp      Temp      Temp src      SpO2      Weight      Height      Head Circumference      Peak Flow      Pain Score      Pain Loc      Pain Education      Exclude from Growth Chart     Most recent vital signs: There were no vitals filed for this visit.  I have reviewed the vital signs. General: Respiratory distress, anxious, diaphoretic Head:  Normocephalic, Atraumatic. EENT:  PERRL, EOMI, Oral mucosa pink and moist, Neck is supple. Cardiovascular: Tachycardic rate, 2+ distal pulses. Respiratory: Respiratory distress, unable to obtain a saturation, lung sounds clear otherwise Extremities:  Moving all four extremities through full ROM without pain.   Neuro:  Alert and oriented.  Interacting appropriately.    Skin:  Warm, dry, no rash.   Psych: Appropriate affect.    ED Results / Procedures / Treatments   Labs (all labs ordered are listed, but only abnormal results are displayed) Labs Reviewed  CBC - Abnormal; Notable for the following components:      Result Value   WBC 13.0 (*)    MCV 101.0 (*)    All other components within normal limits  COMPREHENSIVE METABOLIC PANEL WITH GFR - Abnormal; Notable for the following components:   Glucose, Bld 289 (*)    BUN 32 (*)    Creatinine, Ser 1.27 (*)    Calcium 8.7 (*)    Total Protein 5.6 (*)    Albumin 3.2 (*)    AST 57 (*)    ALT 82 (*)    GFR, Estimated 45 (*)    All other components within normal limits  LACTIC ACID, PLASMA - Abnormal; Notable for the following components:   Lactic Acid, Venous 5.9 (*)    All other components within normal limits  APTT - Abnormal; Notable for the following components:   aPTT 22 (*)    All other components within normal limits  CBG MONITORING, ED - Abnormal; Notable for the following components:   Glucose-Capillary 188 (*)    All other components within normal limits  TROPONIN I (HIGH SENSITIVITY) - Abnormal; Notable for the following components:   Troponin I (High Sensitivity) 567 (*)    All other components within normal limits  PROTIME-INR     EKG My EKG interpretation: Rate of 116, sinus tachycardia.  Right bundle branch block.  No obvious ST elevation or depression   RADIOLOGY    PROCEDURES:  Critical Care performed: Yes, see critical care procedure note(s)  .Critical Care  Performed by: Kandee Orion, MD Authorized by: Kandee Orion, MD   Critical care provider statement:    Critical care time (minutes):  60   Critical care time was exclusive of:  Separately billable procedures and treating other patients   Critical care was necessary to treat or prevent imminent or life-threatening deterioration of the following conditions:  Cardiac failure   Critical care was time spent  personally by me on the following activities:  Development of treatment plan with patient or surrogate, discussions with consultants, evaluation of patient's response to treatment, examination of patient, ordering and review of laboratory studies, ordering and review of radiographic studies, ordering and performing treatments and interventions, pulse oximetry, re-evaluation of patient's condition and review of old charts   I assumed direction of critical care for this patient from another provider in my specialty: no   Procedure Name: Intubation Date/Time: 11/18/2023 6:00 PM  Performed by: Kandee Orion, MDPre-anesthesia Checklist: Patient identified, Patient being monitored, Emergency Drugs available, Timeout performed and Suction available Oxygen Delivery Method: Non-rebreather mask Preoxygenation: Pre-oxygenation with 100% oxygen Induction Type: Rapid sequence Ventilation: Mask ventilation without difficulty Laryngoscope Size: Glidescope and 4 Tube size: 7.5 mm Number of attempts: 2 Airway Equipment and Method: Stylet and Video-laryngoscopy Placement Confirmation: ETT inserted through vocal cords under direct vision, CO2 detector and Breath sounds checked- equal and bilateral Secured at: 23 cm Tube secured with: ETT holder       MEDICATIONS ORDERED IN ED: Medications  tenecteplase (TNKASE) 50 MG injection for PE/MI (has no administration in time range)  rocuronium (ZEMURON) 100 MG/10ML injection (has no administration in time range)  EPINEPHrine  (ADRENALIN ) 1 MG/10ML injection (1 mg Intravenous Given 12/01/2023 1650)  EPINEPHrine  (ADRENALIN ) 1 MG/10ML injection (1 mg Intravenous Given 11/22/2023 1657)  rocuronium (ZEMURON) injection (80 mg Intravenous Given 11/14/2023 1623)  calcium chloride injection (1 g Intravenous Given 11/14/2023 1631)  sodium bicarbonate injection (50 mEq Intravenous Given 12/09/2023 1650)  EPINEPHrine  (ADRENALIN ) 1 MG/10ML injection (1 mg Intravenous Given 11/17/2023 1638)   calcium chloride injection (1 g Intravenous Given 12/11/2023 1652)     IMPRESSION / MDM / ASSESSMENT AND PLAN / ED COURSE  I reviewed the triage vital signs and the nursing notes.                              Differential diagnosis includes, but is not limited to, pulmonary embolism, ACS, electrolyte abnormality, cardiac arrhythmia  Patient's presentation is most consistent with acute presentation with potential threat to life or bodily function.  Patient is a 73 year old female presenting for loss of consciousness.  On arrival she is complaining of difficulty breathing although no abnormal lung sounds auscultated at the bedside.  She is tachycardic and we have difficulty obtaining a blood pressure.  Also difficulty obtaining a constant oxygen saturation even on the 15 L.  Went to go grab ultrasound and by the time I returned within the first 10 minutes of arrival patient went into cardiac arrest.  CPR initiated and patient intubated at the bedside.  Multiple rounds of epinephrine  were given.  Patient also received bicarb, and calcium during this.  She did have a recent surgery 3 months ago on her legs so PE was high on the differential given her respiratory distress and acute onset of symptoms.  TNK was given because of this and no sign of STEMI on EKG.  CPR continued for at least 45 total minutes with each pulse check being negative and patient either in PEA or asystole.  Discussed with family in the conference room while CPR was continuing.  At this point, no response to the TNK and unable to obtain ROSC throughout 45 minutes of CPR.  Ongoing CPR became futile at this point and family understanding of need to stop.  Last pulse check showed asystole and no ongoing rhythm on ultrasound of the heart.  Time of death 67.  Called medical examiner.  Not a medical examiner case.  The patient is on the cardiac monitor to evaluate for evidence of arrhythmia and/or significant heart rate changes. Clinical  Course as of 11/19/2023 1834  Sun Nov 21, 2023  1832 Medical examiner - not an ME case [DW]    Clinical Course User Index [DW] Karlynn Oyster Achilles Holes, MD     FINAL CLINICAL IMPRESSION(S) / ED DIAGNOSES   Final diagnoses:  Cardiac arrest [facility])     Rx / DC Orders   ED Discharge Orders     None        Note:  This document was prepared using Dragon voice recognition software and may include unintentional dictation errors.   Kandee Orion, MD 11/16/2023 1800    Kandee Orion, MD 11/30/2023 [phone]

## 2023-12-12 NOTE — Code Documentation (Signed)
 CPR initiated

## 2023-12-12 NOTE — Progress Notes (Signed)
 Spoke with [name] by telephone. He stated he was on his way up and was coming with other family members. Explained she had a cardiac arrest and we were working with her.

## 2023-12-12 DEATH — deceased
